# Patient Record
Sex: Male | Born: 1955 | Race: White | Hispanic: No | Marital: Married
Health system: Southern US, Community
[De-identification: ages and names within clinical notes are randomized; demographics above are authoritative.]

## PROBLEM LIST (undated history)

## (undated) DIAGNOSIS — M1990 Unspecified osteoarthritis, unspecified site: Secondary | ICD-10-CM

## (undated) DIAGNOSIS — I4510 Unspecified right bundle-branch block: Secondary | ICD-10-CM

## (undated) DIAGNOSIS — I2510 Atherosclerotic heart disease of native coronary artery without angina pectoris: Secondary | ICD-10-CM

## (undated) DIAGNOSIS — E7800 Pure hypercholesterolemia, unspecified: Secondary | ICD-10-CM

## (undated) DIAGNOSIS — K219 Gastro-esophageal reflux disease without esophagitis: Secondary | ICD-10-CM

## (undated) HISTORY — PX: VASECTOMY: SHX75

## (undated) HISTORY — PX: MENISCUS REPAIR: SHX5179

---

## 2008-01-04 ENCOUNTER — Encounter: Admission: RE | Admit: 2008-01-04 | Discharge: 2008-01-04 | Payer: Self-pay | Admitting: Orthopaedic Surgery

## 2010-07-12 IMAGING — CT CT L SPINE W/O CM
3 of 11 series · 10 of 33 positions shown, 12 images · non-contrast
Comparison: None

CLINICAL DATA: Fell.  T12 fracture.

CT LUMBAR SPINE WITHOUT CONTRAST
TECHNIQUE: Multidetector CT imaging of the lumbar spine was
performed without intravenous contrast administration. Multiplanar
CT image reconstructions were also generated.

[Series 3: bone windows · axial · 0.27mm/px · z∈[-47,+38]mm · 2 of 103 slices shown, 3 images]
[im 35/103  soft-tissue]
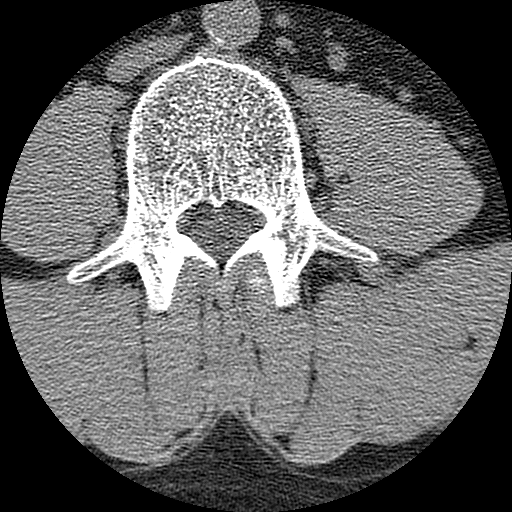
[im 35/103  bone]
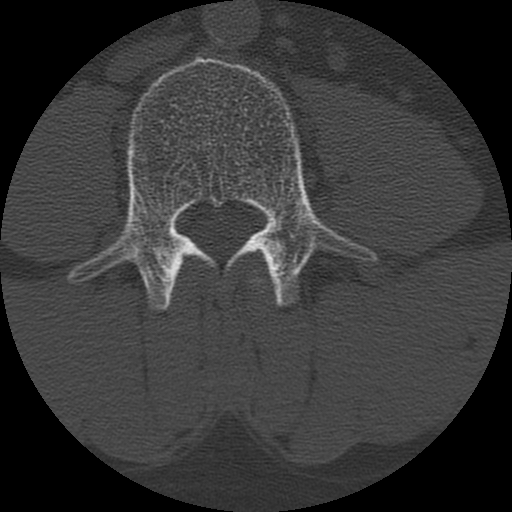
[im 69/103  bone]
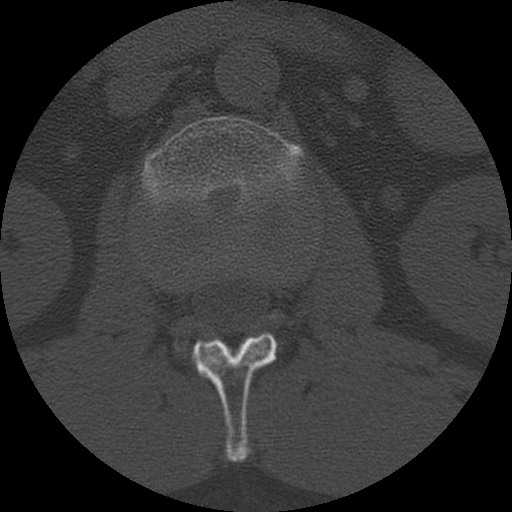

[Series 400: sagittal · sagittal · 0.51mm/px · 5 of 40 slices shown, 6 images]
[im 14/40  bone]
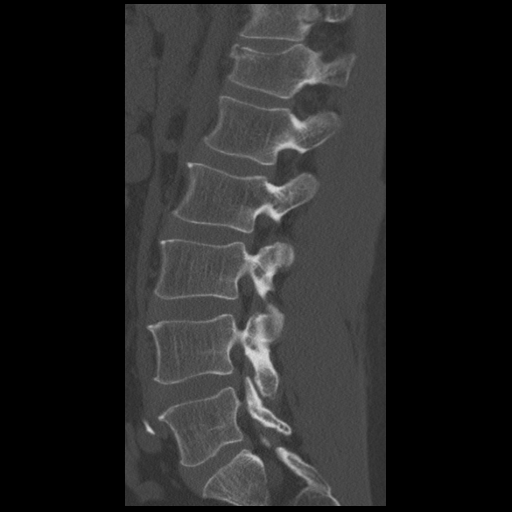
[im 17/40  bone]
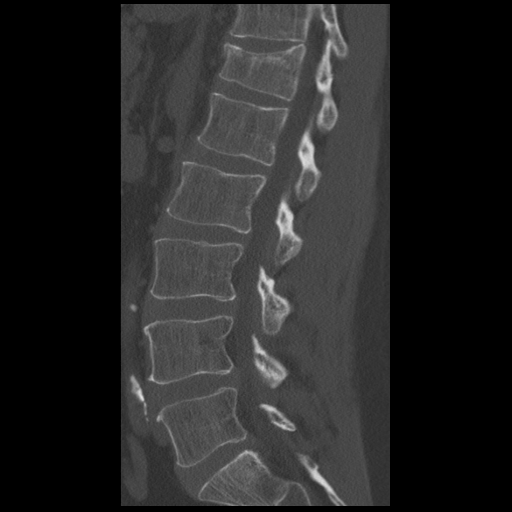
[im 20/40  soft-tissue]
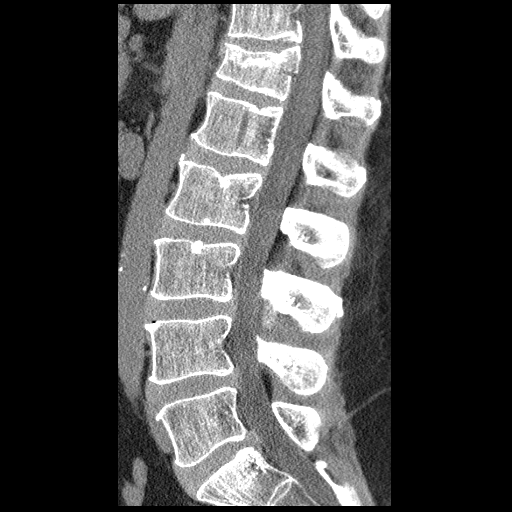
[im 20/40  bone]
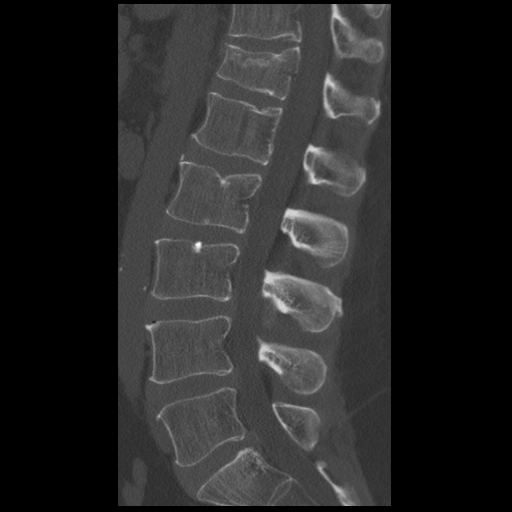
[im 23/40  bone]
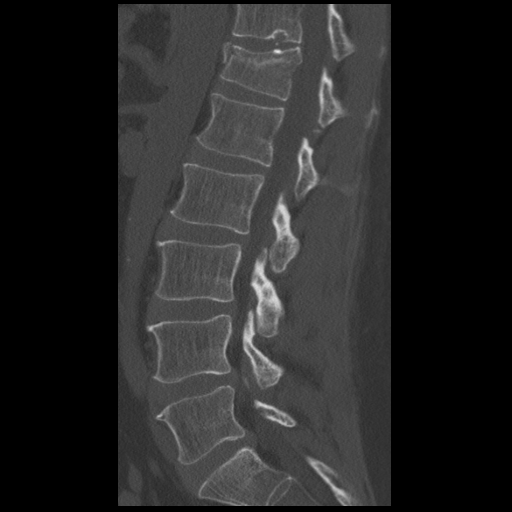
[im 27/40  bone]
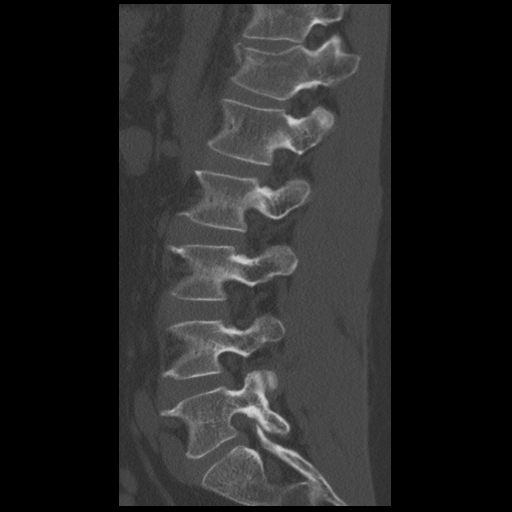

[Series 401: coronal · coronal · 0.51mm/px · 3 of 45 slices shown]
[im 9/45  bone]
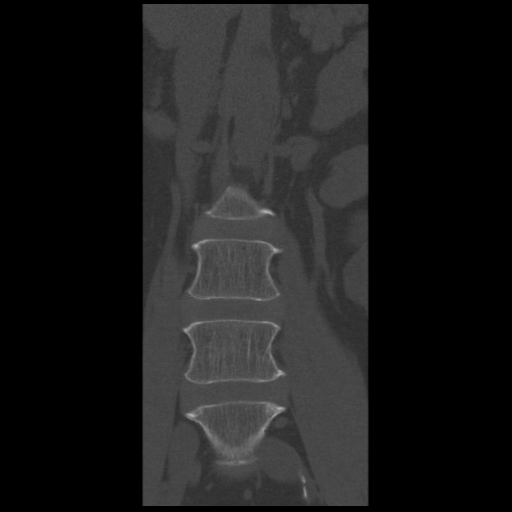
[im 18/45  bone]
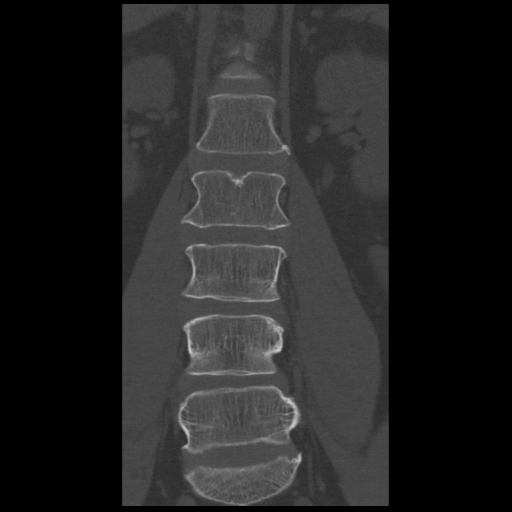
[im 27/45  bone]
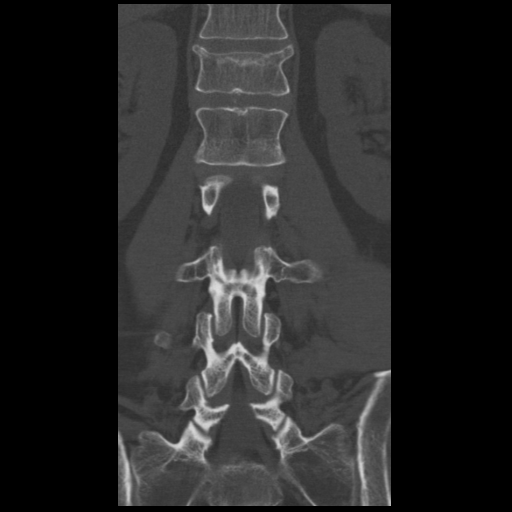

[10 of 33 positions shown; findings below may reference images not displayed]

FINDINGS: The scan begins at the mid T11.  No fracture of the
inferior aspect of T11 is seen.  The T11-12 disc does not show any
traumatic herniation.

T12 shows a superior endplate compression fracture with loss of
height of one third.  There is minimal posterior bowing of the
posterior superior margin of the vertebral body, no more than 2 mm.
This does not appear to encroach upon the spinal canal in any
significant fashion.  The fracture appears symmetric from right to
left.  There is no involvement of the posterior elements.  The T12-
L1 disc does not show any traumatic disc herniation.

No abnormality of note is present at L1-2, L2-3 or L3-4.

At L4-5, the disc bulges mildly.  There is mild narrowing of the
lateral recesses without gross neural compression.

At L5-S1, the disc shows a shallow broad-based protrusion but does
not appear to cause neural compression.
IMPRESSION: Ordinary benign-appearing superior endplate compression fracture at
T12 with loss of height of one third.  2 mm posterior bowing of the
posterior superior margin of the vertebral body, without
significant encroachment upon the spinal canal.  No unexpected
components of the fracture.  No involvement of the posterior
elements.

## 2019-02-25 ENCOUNTER — Ambulatory Visit: Payer: BC Managed Care – PPO | Attending: Internal Medicine

## 2019-02-25 DIAGNOSIS — Z23 Encounter for immunization: Secondary | ICD-10-CM | POA: Insufficient documentation

## 2019-02-25 NOTE — Progress Notes (Signed)
   Covid-19 Vaccination Clinic  Name:  Clarence Foster    MRN: [id number] DOB: 1955/05/29  02/25/2019  [name] was observed post Covid-19 immunization for 15 minutes without incidence. He was provided with Vaccine Information Sheet and instruction to access the V-Safe system.   [name] was instructed to call 911 with any severe reactions post vaccine: Marland Kitchen Difficulty breathing  . Swelling of your face and throat  . A fast heartbeat  . A bad rash all over your body  . Dizziness and weakness    Immunizations Administered    Name Date Dose VIS Date Route   Pfizer COVID-19 Vaccine 02/25/2019 12:52 PM 0.3 mL 01/14/2019 Intramuscular   Manufacturer: [employer], [employer]   Lot: [id number]   NDC: [address]-2

## 2019-03-10 ENCOUNTER — Ambulatory Visit: Payer: Self-pay

## 2019-03-17 ENCOUNTER — Ambulatory Visit: Payer: BC Managed Care – PPO | Attending: Internal Medicine

## 2019-03-17 DIAGNOSIS — Z23 Encounter for immunization: Secondary | ICD-10-CM | POA: Insufficient documentation

## 2019-03-17 NOTE — Progress Notes (Signed)
   Covid-19 Vaccination Clinic  Name:  AYDEEN BLUME    MRN: [id number] DOB: Jul 10, 1955  03/17/2019  Mr. Yoo was observed post Covid-19 immunization for 15 minutes without incidence. He was provided with Vaccine Information Sheet and instruction to access the V-Safe system.   Mr. Amory was instructed to call 911 with any severe reactions post vaccine: Marland Kitchen Difficulty breathing  . Swelling of your face and throat  . A fast heartbeat  . A bad rash all over your body  . Dizziness and weakness    Immunizations Administered    Name Date Dose VIS Date Route   Pfizer COVID-19 Vaccine 03/17/2019  3:50 PM 0.3 mL 01/14/2019 Intramuscular   Manufacturer: [employer], [employer]   Lot: [id number]   NDC: [address]-3

## 2019-07-11 DIAGNOSIS — S20479A Other superficial bite of unspecified back wall of thorax, initial encounter: Secondary | ICD-10-CM | POA: Diagnosis not present

## 2019-07-11 DIAGNOSIS — W57XXXA Bitten or stung by nonvenomous insect and other nonvenomous arthropods, initial encounter: Secondary | ICD-10-CM | POA: Diagnosis not present

## 2019-08-28 DIAGNOSIS — J209 Acute bronchitis, unspecified: Secondary | ICD-10-CM | POA: Diagnosis not present

## 2019-08-28 DIAGNOSIS — J22 Unspecified acute lower respiratory infection: Secondary | ICD-10-CM | POA: Diagnosis not present

## 2019-08-28 DIAGNOSIS — Z03818 Encounter for observation for suspected exposure to other biological agents ruled out: Secondary | ICD-10-CM | POA: Diagnosis not present

## 2019-11-29 DIAGNOSIS — L570 Actinic keratosis: Secondary | ICD-10-CM | POA: Diagnosis not present

## 2019-11-29 DIAGNOSIS — D225 Melanocytic nevi of trunk: Secondary | ICD-10-CM | POA: Diagnosis not present

## 2019-11-29 DIAGNOSIS — X32XXXA Exposure to sunlight, initial encounter: Secondary | ICD-10-CM | POA: Diagnosis not present

## 2019-11-29 DIAGNOSIS — L720 Epidermal cyst: Secondary | ICD-10-CM | POA: Diagnosis not present

## 2020-01-25 DIAGNOSIS — Z20822 Contact with and (suspected) exposure to covid-19: Secondary | ICD-10-CM | POA: Diagnosis not present

## 2020-03-27 DIAGNOSIS — M545 Low back pain, unspecified: Secondary | ICD-10-CM | POA: Diagnosis not present

## 2020-03-27 DIAGNOSIS — H8113 Benign paroxysmal vertigo, bilateral: Secondary | ICD-10-CM | POA: Diagnosis not present

## 2020-03-27 DIAGNOSIS — Z125 Encounter for screening for malignant neoplasm of prostate: Secondary | ICD-10-CM | POA: Diagnosis not present

## 2020-03-27 DIAGNOSIS — Z23 Encounter for immunization: Secondary | ICD-10-CM | POA: Diagnosis not present

## 2020-03-27 DIAGNOSIS — E7800 Pure hypercholesterolemia, unspecified: Secondary | ICD-10-CM | POA: Diagnosis not present

## 2020-03-27 DIAGNOSIS — G4700 Insomnia, unspecified: Secondary | ICD-10-CM | POA: Diagnosis not present

## 2020-03-27 DIAGNOSIS — L300 Nummular dermatitis: Secondary | ICD-10-CM | POA: Diagnosis not present

## 2020-03-27 DIAGNOSIS — Z1211 Encounter for screening for malignant neoplasm of colon: Secondary | ICD-10-CM | POA: Diagnosis not present

## 2020-03-27 DIAGNOSIS — Z0000 Encounter for general adult medical examination without abnormal findings: Secondary | ICD-10-CM | POA: Diagnosis not present

## 2020-06-01 DIAGNOSIS — R059 Cough, unspecified: Secondary | ICD-10-CM | POA: Diagnosis not present

## 2020-06-01 DIAGNOSIS — U071 COVID-19: Secondary | ICD-10-CM | POA: Diagnosis not present

## 2020-06-01 DIAGNOSIS — R509 Fever, unspecified: Secondary | ICD-10-CM | POA: Diagnosis not present

## 2020-06-01 DIAGNOSIS — J069 Acute upper respiratory infection, unspecified: Secondary | ICD-10-CM | POA: Diagnosis not present

## 2020-07-31 DIAGNOSIS — D122 Benign neoplasm of ascending colon: Secondary | ICD-10-CM | POA: Diagnosis not present

## 2020-07-31 DIAGNOSIS — K644 Residual hemorrhoidal skin tags: Secondary | ICD-10-CM | POA: Diagnosis not present

## 2020-07-31 DIAGNOSIS — K648 Other hemorrhoids: Secondary | ICD-10-CM | POA: Diagnosis not present

## 2020-07-31 DIAGNOSIS — D123 Benign neoplasm of transverse colon: Secondary | ICD-10-CM | POA: Diagnosis not present

## 2020-07-31 DIAGNOSIS — Z1211 Encounter for screening for malignant neoplasm of colon: Secondary | ICD-10-CM | POA: Diagnosis not present

## 2020-07-31 DIAGNOSIS — K5730 Diverticulosis of large intestine without perforation or abscess without bleeding: Secondary | ICD-10-CM | POA: Diagnosis not present

## 2020-08-03 DIAGNOSIS — D122 Benign neoplasm of ascending colon: Secondary | ICD-10-CM | POA: Diagnosis not present

## 2020-08-03 DIAGNOSIS — D123 Benign neoplasm of transverse colon: Secondary | ICD-10-CM | POA: Diagnosis not present

## 2020-08-30 DIAGNOSIS — M545 Low back pain, unspecified: Secondary | ICD-10-CM | POA: Diagnosis not present

## 2020-10-12 DIAGNOSIS — T148XXA Other injury of unspecified body region, initial encounter: Secondary | ICD-10-CM | POA: Diagnosis not present

## 2020-11-23 DIAGNOSIS — C44319 Basal cell carcinoma of skin of other parts of face: Secondary | ICD-10-CM | POA: Diagnosis not present

## 2020-11-23 DIAGNOSIS — L720 Epidermal cyst: Secondary | ICD-10-CM | POA: Diagnosis not present

## 2020-11-23 DIAGNOSIS — L570 Actinic keratosis: Secondary | ICD-10-CM | POA: Diagnosis not present

## 2020-11-23 DIAGNOSIS — D225 Melanocytic nevi of trunk: Secondary | ICD-10-CM | POA: Diagnosis not present

## 2020-11-23 DIAGNOSIS — X32XXXD Exposure to sunlight, subsequent encounter: Secondary | ICD-10-CM | POA: Diagnosis not present

## 2020-11-29 DIAGNOSIS — J209 Acute bronchitis, unspecified: Secondary | ICD-10-CM | POA: Diagnosis not present

## 2020-11-29 DIAGNOSIS — R0981 Nasal congestion: Secondary | ICD-10-CM | POA: Diagnosis not present

## 2021-04-08 DIAGNOSIS — E7800 Pure hypercholesterolemia, unspecified: Secondary | ICD-10-CM | POA: Diagnosis not present

## 2021-04-08 DIAGNOSIS — H8113 Benign paroxysmal vertigo, bilateral: Secondary | ICD-10-CM | POA: Diagnosis not present

## 2021-04-08 DIAGNOSIS — G4700 Insomnia, unspecified: Secondary | ICD-10-CM | POA: Diagnosis not present

## 2021-04-08 DIAGNOSIS — Z1389 Encounter for screening for other disorder: Secondary | ICD-10-CM | POA: Diagnosis not present

## 2021-04-08 DIAGNOSIS — K219 Gastro-esophageal reflux disease without esophagitis: Secondary | ICD-10-CM | POA: Diagnosis not present

## 2021-04-08 DIAGNOSIS — Z0000 Encounter for general adult medical examination without abnormal findings: Secondary | ICD-10-CM | POA: Diagnosis not present

## 2021-04-08 DIAGNOSIS — Z125 Encounter for screening for malignant neoplasm of prostate: Secondary | ICD-10-CM | POA: Diagnosis not present

## 2021-04-08 DIAGNOSIS — Z23 Encounter for immunization: Secondary | ICD-10-CM | POA: Diagnosis not present

## 2021-04-08 DIAGNOSIS — L300 Nummular dermatitis: Secondary | ICD-10-CM | POA: Diagnosis not present

## 2021-04-08 DIAGNOSIS — M545 Low back pain, unspecified: Secondary | ICD-10-CM | POA: Diagnosis not present

## 2021-05-01 DIAGNOSIS — R051 Acute cough: Secondary | ICD-10-CM | POA: Diagnosis not present

## 2021-05-01 DIAGNOSIS — J0140 Acute pansinusitis, unspecified: Secondary | ICD-10-CM | POA: Diagnosis not present

## 2021-05-28 DIAGNOSIS — J069 Acute upper respiratory infection, unspecified: Secondary | ICD-10-CM | POA: Diagnosis not present

## 2021-05-28 DIAGNOSIS — J0100 Acute maxillary sinusitis, unspecified: Secondary | ICD-10-CM | POA: Diagnosis not present

## 2021-06-06 DIAGNOSIS — J208 Acute bronchitis due to other specified organisms: Secondary | ICD-10-CM | POA: Diagnosis not present

## 2021-06-06 DIAGNOSIS — J4540 Moderate persistent asthma, uncomplicated: Secondary | ICD-10-CM | POA: Diagnosis not present

## 2021-08-31 DIAGNOSIS — M79671 Pain in right foot: Secondary | ICD-10-CM | POA: Diagnosis not present

## 2021-08-31 DIAGNOSIS — M722 Plantar fascial fibromatosis: Secondary | ICD-10-CM | POA: Diagnosis not present

## 2021-09-05 ENCOUNTER — Encounter: Payer: Self-pay | Admitting: Orthopaedic Surgery

## 2021-09-05 ENCOUNTER — Ambulatory Visit: Payer: Medicare PPO | Admitting: Orthopaedic Surgery

## 2021-09-05 DIAGNOSIS — M722 Plantar fascial fibromatosis: Secondary | ICD-10-CM | POA: Diagnosis not present

## 2021-09-05 MED ORDER — METHYLPREDNISOLONE ACETATE 40 MG/ML IJ SUSP
40.0000 mg | INTRAMUSCULAR | Status: AC | PRN
  Administered 2021-09-05: 40 mg

## 2021-09-05 MED ORDER — LIDOCAINE HCL 1 % IJ SOLN
1.0000 mL | INTRAMUSCULAR | Status: AC | PRN
  Administered 2021-09-05: 1 mL

## 2021-09-05 NOTE — Progress Notes (Signed)
Office Visit Note   Patient: Clarence Foster           Date of Birth: 1956/01/21           MRN: [id number] Visit Date: 09/05/2021              Requested by: No referring provider defined for this encounter. PCP: Patient, No Pcp Per   Assessment & Plan: Visit Diagnoses:  1. Plantar fasciitis of right foot     Plan: Clarence Foster is a pleasant 66 year old gentleman with a chief complaint of right plantar heel pain for 2 weeks.  He admits this may have been going on a bit longer but it became acute in the last 2 weeks.  He denies any injuries.  He was seen at an urgent care because of the pain.  Unfortunately he cannot take anti-inflammatories but they did give him a few prednisone which she is unsure if it is helped much.  He has been using Voltaren gel as well as a small strap on arch support which she actually thinks is helped a little bit denies any posterior heel pain but points to the pain on the bottom of his heel at the insertion of the plantar fascia.  Findings consistent with plantar fasciitis.  On exam he is focally tender at the plantar insertion of the plantar fascia.  He does not have any pain posteriorly.  X-rays he brought with him demonstrate a small bone spur at the plantar insertion of the plantar fascia as well as the posterior calcaneus because this is been painful and he cannot take anti-inflammatories we suggested an injection today.  He can also continue use Voltaren gel.  We have given him full-length arch support.  Also information on stretching properly.  May follow-up as needed  Follow-Up Instructions: As needed  Orders:  Orders Placed This Encounter  Procedures   Foot Inj   No orders of the defined types were placed in this encounter.     Procedures: Foot Inj  Date/Time: 09/05/2021 2:27 PM  Performed by: Clarence Foster, [place], [place] Authorized by: Clarence Foster, [place], [place]   Consent Given by:  Patient Indications:  Fasciitis and pain Condition: Plantar Fasciitis    Location: right plantar fascia muscle   Prep: patient was prepped and draped in usual sterile fashion   Needle Size:  22 G and 27 G Approach:  Plantar Medications:  1 mL lidocaine 1 %; 40 mg methylPREDNISolone acetate 40 MG/ML Patient Tolerance:  Patient tolerated the procedure well with no immediate complications    Clinical Data: No additional findings.   Subjective: Chief Complaint  Patient presents with   Right Heel - New Patient (Initial Visit)   Patient presents today as a new patient for right heel pain. Reports that pain was noticed around 3-6 months ago and now within the past 1-2 weeks he has been having increased sensitivity and unable to apply weight. Patient was informed that he had a bone spur ad plantar fasciitis. He denies having any injury or fall to have started the pain nor has he had any previous surgeries. Prednisone 10mg  has been prescribed and completed.     Review of Systems  All other systems reviewed and are negative.    Objective: Vital Signs: There were no vitals taken for this visit.  Physical Exam Constitutional:      Appearance: Normal appearance.  Pulmonary:     Effort: Pulmonary effort is normal.  Neurological:  General: No focal deficit present.     Mental Status: He is alert.     Ortho Exam  Specialty Comments:  No specialty comments available.  Imaging: No results found.   PMFS History: Patient Active Problem List   Diagnosis Date Noted   Plantar fasciitis of right foot 09/05/2021   History reviewed. No pertinent past medical history.  History reviewed. No pertinent family history.  History reviewed. No pertinent surgical history. Social History   Occupational History   Not on file  Tobacco Use   Smoking status: Not on file   Smokeless tobacco: Not on file  Substance and Sexual Activity   Alcohol use: Not on file   Drug use: Not on file   Sexual activity: Not on file

## 2021-10-02 ENCOUNTER — Telehealth: Payer: Self-pay

## 2021-10-02 ENCOUNTER — Ambulatory Visit: Payer: Medicare PPO | Admitting: Orthopaedic Surgery

## 2021-10-02 ENCOUNTER — Encounter: Payer: Self-pay | Admitting: Orthopaedic Surgery

## 2021-10-02 DIAGNOSIS — M25571 Pain in right ankle and joints of right foot: Secondary | ICD-10-CM | POA: Diagnosis not present

## 2021-10-02 DIAGNOSIS — M722 Plantar fascial fibromatosis: Secondary | ICD-10-CM

## 2021-10-02 MED ORDER — METHYLPREDNISOLONE ACETATE 40 MG/ML IJ SUSP
40.0000 mg | INTRAMUSCULAR | Status: AC | PRN
  Administered 2021-10-02: 40 mg

## 2021-10-02 MED ORDER — LIDOCAINE HCL 1 % IJ SOLN
1.0000 mL | INTRAMUSCULAR | Status: AC | PRN
  Administered 2021-10-02: 1 mL

## 2021-10-02 NOTE — Progress Notes (Signed)
Office Visit Note   Patient: Clarence Foster           Date of Birth: 1955/03/24           MRN: [id number] Visit Date: 10/02/2021              Requested by: No referring provider defined for this encounter. PCP: Patient, No Pcp Per   Assessment & Plan: Visit Diagnoses:  1. Plantar fasciitis of right foot     Plan: [name] is a pleasant 66 year old gentleman with a history of plantar fasciitis in his right heel.  3 weeks ago he was injected with cortisone and local anesthetic.  He now has pain and a little bit different area to and is wondering if he can have this injected.  No new injury.  Repeat cortisone injection to the mid heel with relief of his pain.  He will wear comfortable arch supports  Follow-Up Instructions: As needed  Orders:  No orders of the defined types were placed in this encounter.  No orders of the defined types were placed in this encounter.     Procedures: Foot Inj  Date/Time: 10/02/2021 2:49 PM  Performed by: Carlito Bogert, [place], [place] Authorized by: Idalie Canto, [place], [place]   Consent Given by:  Patient Timeout: prior to procedure the correct patient, procedure, and site was verified   Indications:  Fasciitis and pain Condition: Plantar Fasciitis   Location: right plantar fascia muscle   Needle Size:  22 G and 27 G Approach:  Plantar Medications:  1 mL lidocaine 1 %; 40 mg methylPREDNISolone acetate 40 MG/ML Patient Tolerance:  Patient tolerated the procedure well with no immediate complications   Clinical Data: No additional findings.   Subjective: Chief Complaint  Patient presents with   Right Heel - Follow-up  Patient presents today for follow up on his right heel. He received a cortisone injection with Dr.Whitfield a few weeks ago. He said that the injection helped at the site of injection, but is now having pain in the bottom center of his heel. He said that it does not matter if he is resting or not.   HPI  Review of Systems  All  other systems reviewed and are negative.    Objective: Vital Signs: There were no vitals taken for this visit.  Physical Exam Constitutional:      Appearance: Normal appearance.  Pulmonary:     Effort: Pulmonary effort is normal.  Skin:    General: Skin is warm and dry.  Neurological:     Mental Status: He is alert.  Psychiatric:        Mood and Affect: Mood normal.    Ortho Exam Examination of his right foot he has no redness no swelling.  Foot is warm with brisk capillary refill easily palpable dorsal pulse.  He does have a focal area of tenderness in the central portion of the plantar surface of the heel sensation is intact Specialty Comments:  No specialty comments available.  Imaging: No results found.   PMFS History: Patient Active Problem List   Diagnosis Date Noted   Plantar fasciitis of right foot 09/05/2021   History reviewed. No pertinent past medical history.  History reviewed. No pertinent family history.  History reviewed. No pertinent surgical history. Social History   Occupational History   Not on file  Tobacco Use   Smoking status: Not on file   Smokeless tobacco: Not on file  Substance and Sexual Activity  Alcohol use: Not on file   Drug use: Not on file   Sexual activity: Not on file

## 2021-10-02 NOTE — Telephone Encounter (Signed)
Per pt "the center of my heel is quite painful and I'm wondering if another injection would be helpful. A phone call is fine to discuss the possibility."  Cb# [phone]  Please advise

## 2021-10-02 NOTE — Telephone Encounter (Signed)
Pt portal message sent to the pt advising

## 2021-10-02 NOTE — Telephone Encounter (Signed)
Call and have pt return for another cortisone injection-thanks

## 2021-11-28 ENCOUNTER — Encounter: Payer: Self-pay | Admitting: Orthopaedic Surgery

## 2021-11-28 ENCOUNTER — Ambulatory Visit: Payer: Medicare PPO | Admitting: Orthopaedic Surgery

## 2021-11-28 DIAGNOSIS — M722 Plantar fascial fibromatosis: Secondary | ICD-10-CM | POA: Diagnosis not present

## 2021-11-28 NOTE — Progress Notes (Signed)
Office Visit Note   Patient: Clarence Foster           Date of Birth: 1955-04-30           MRN: [id number] Visit Date: 11/28/2021              Requested by: No referring provider defined for this encounter. PCP: Patient, No Pcp Per   Assessment & Plan: Visit Diagnoses:  1. Plantar fasciitis of right foot     Plan: Mr. Abernethy has been followed recently for the plantar fasciitis of the right heel.  He had a cortisone injection in early and late August with significant reduction of his pain but still having 1 area in the mid plantar aspect of his os calcis that still uncomfortable.  He is try different shoe inserts.  We discussed different treatment options including an another injection, heel pads and even an MRI scan to rule out some other abnormality like a stress fracture.  He like to try 1 more injection over the point of greatest tenderness, the heel pads and then over the next 2 to 3 weeks will call me if not any better will obtain an MRI scan of the right heel  Follow-Up Instructions: Return if symptoms worsen or fail to improve.   Orders:  No orders of the defined types were placed in this encounter.  No orders of the defined types were placed in this encounter.     Procedures: No procedures performed   Clinical Data: No additional findings.   Subjective: Chief Complaint  Patient presents with   Right Foot - Pain  Patient presents today for follow up on his right heel pain. He said that he has had two injection with Dr.Addelynn Batte in the last two months for plantar fasciitis. He continues to have pain in the bottom of his heel. He takes Tylenol if needed. He said that the pain is starting to affect how he walks.   HPI  Review of Systems   Objective: Vital Signs: There were no vitals taken for this visit.  Physical Exam Constitutional:      Appearance: He is well-developed.  Eyes:     Pupils: Pupils are equal, round, and reactive to light.  Pulmonary:      Effort: Pulmonary effort is normal.  Skin:    General: Skin is warm and dry.  Neurological:     Mental Status: He is alert and oriented to person, place, and time.  Psychiatric:        Behavior: Behavior normal.     Ortho Exam awake alert and oriented x3.  Comfortable sitting.  Right heel with an area of tenderness of about the size of a dime in the mid plantar aspect of the heel.  No skin changes neurologically intact.  No pain in the arch.  No pain about the posterior os calcis or Achilles.  Specialty Comments:  No specialty comments available.  Imaging: No results found.   PMFS History: Patient Active Problem List   Diagnosis Date Noted   Plantar fasciitis of right foot 09/05/2021   History reviewed. No pertinent past medical history.  History reviewed. No pertinent family history.  History reviewed. No pertinent surgical history. Social History   Occupational History   Not on file  Tobacco Use   Smoking status: Not on file   Smokeless tobacco: Not on file  Substance and Sexual Activity   Alcohol use: Not on file   Drug use: Not on file  Sexual activity: Not on file

## 2021-12-18 DIAGNOSIS — R0981 Nasal congestion: Secondary | ICD-10-CM | POA: Diagnosis not present

## 2021-12-18 DIAGNOSIS — J329 Chronic sinusitis, unspecified: Secondary | ICD-10-CM | POA: Diagnosis not present

## 2021-12-18 DIAGNOSIS — J40 Bronchitis, not specified as acute or chronic: Secondary | ICD-10-CM | POA: Diagnosis not present

## 2021-12-18 DIAGNOSIS — R059 Cough, unspecified: Secondary | ICD-10-CM | POA: Diagnosis not present

## 2022-02-07 DIAGNOSIS — R059 Cough, unspecified: Secondary | ICD-10-CM | POA: Diagnosis not present

## 2022-02-07 DIAGNOSIS — U071 COVID-19: Secondary | ICD-10-CM | POA: Diagnosis not present

## 2022-03-12 DIAGNOSIS — J329 Chronic sinusitis, unspecified: Secondary | ICD-10-CM | POA: Diagnosis not present

## 2022-04-22 DIAGNOSIS — G4700 Insomnia, unspecified: Secondary | ICD-10-CM | POA: Diagnosis not present

## 2022-04-22 DIAGNOSIS — M545 Low back pain, unspecified: Secondary | ICD-10-CM | POA: Diagnosis not present

## 2022-04-22 DIAGNOSIS — E7800 Pure hypercholesterolemia, unspecified: Secondary | ICD-10-CM | POA: Diagnosis not present

## 2022-04-22 DIAGNOSIS — J302 Other seasonal allergic rhinitis: Secondary | ICD-10-CM | POA: Diagnosis not present

## 2022-04-22 DIAGNOSIS — L300 Nummular dermatitis: Secondary | ICD-10-CM | POA: Diagnosis not present

## 2022-04-22 DIAGNOSIS — Z0000 Encounter for general adult medical examination without abnormal findings: Secondary | ICD-10-CM | POA: Diagnosis not present

## 2022-04-22 DIAGNOSIS — Z125 Encounter for screening for malignant neoplasm of prostate: Secondary | ICD-10-CM | POA: Diagnosis not present

## 2022-04-22 DIAGNOSIS — Z1159 Encounter for screening for other viral diseases: Secondary | ICD-10-CM | POA: Diagnosis not present

## 2022-04-22 DIAGNOSIS — K219 Gastro-esophageal reflux disease without esophagitis: Secondary | ICD-10-CM | POA: Diagnosis not present

## 2022-04-22 DIAGNOSIS — N401 Enlarged prostate with lower urinary tract symptoms: Secondary | ICD-10-CM | POA: Diagnosis not present

## 2022-08-08 ENCOUNTER — Ambulatory Visit (INDEPENDENT_AMBULATORY_CARE_PROVIDER_SITE_OTHER): Payer: Medicare PPO | Admitting: Student

## 2022-08-08 ENCOUNTER — Encounter (HOSPITAL_BASED_OUTPATIENT_CLINIC_OR_DEPARTMENT_OTHER): Payer: Self-pay | Admitting: Student

## 2022-08-08 ENCOUNTER — Other Ambulatory Visit (HOSPITAL_BASED_OUTPATIENT_CLINIC_OR_DEPARTMENT_OTHER): Payer: Self-pay

## 2022-08-08 ENCOUNTER — Ambulatory Visit (INDEPENDENT_AMBULATORY_CARE_PROVIDER_SITE_OTHER): Payer: Medicare PPO

## 2022-08-08 DIAGNOSIS — S42202A Unspecified fracture of upper end of left humerus, initial encounter for closed fracture: Secondary | ICD-10-CM | POA: Diagnosis not present

## 2022-08-08 DIAGNOSIS — M25512 Pain in left shoulder: Secondary | ICD-10-CM | POA: Diagnosis not present

## 2022-08-08 DIAGNOSIS — S42295A Other nondisplaced fracture of upper end of left humerus, initial encounter for closed fracture: Secondary | ICD-10-CM | POA: Diagnosis not present

## 2022-08-08 MED ORDER — TRAMADOL HCL 50 MG PO TABS
50.0000 mg | ORAL_TABLET | Freq: Four times a day (QID) | ORAL | 0 refills | Status: AC | PRN
  Filled 2022-08-08: qty 12, 3d supply, fill #0

## 2022-08-08 NOTE — Progress Notes (Signed)
Chief Complaint: Left shoulder pain     History of Present Illness:    Clarence Foster is a 67 y.o. male presenting to clinic today for evaluation of pain in his left shoulder.  This began yesterday after he stumbled and directly impacted his shoulder into the side of his JetSki while attempting to remove the cover.  He has had significant increases of swelling in the front of the shoulder and has began bruising.  Pain level today is a 8 out of 10.  In the range of motion of the shoulder sharply increases pain.  Denies any numbness or tingling.  Has been taking Tylenol as he cannot take NSAIDs due to kidney function.   Surgical History:   None  PMH/PSH/Family History/Social History/Meds/Allergies:   History reviewed. No pertinent past medical history. History reviewed. No pertinent surgical history. Social History   Socioeconomic History   Marital status: Married    Spouse name: Not on file   Number of children: Not on file   Years of education: Not on file   Highest education level: Not on file  Occupational History   Not on file  Tobacco Use   Smoking status: Not on file   Smokeless tobacco: Not on file  Substance and Sexual Activity   Alcohol use: Not on file   Drug use: Not on file   Sexual activity: Not on file  Other Topics Concern   Not on file  Social History Narrative   Not on file   Social Determinants of Health   Financial Resource Strain: Not on file  Food Insecurity: Not on file  Transportation Needs: Not on file  Physical Activity: Not on file  Stress: Not on file  Social Connections: Not on file   History reviewed. No pertinent family history. Allergies  Allergen Reactions   Pseudoephedrine Other (See Comments)   Current Outpatient Medications  Medication Sig Dispense Refill   traMADol (ULTRAM) 50 MG tablet Take 1 tablet (50 mg total) by mouth every 6 (six) hours as needed for up to 3 days. 12 tablet 0   No  current facility-administered medications for this visit.   No results found.  Review of Systems:   A ROS was performed including pertinent positives and negatives as documented in the HPI.  Physical Exam :   Constitutional: NAD and appears stated age Neurological: Alert and oriented Psych: Appropriate affect and cooperative There were no vitals taken for this visit.   Comprehensive Musculoskeletal Exam:    Significant soft tissue edema noted over the anterior left shoulder with mild ecchymosis.  Active shoulder range of motion is very limited due to pain.  No significant tenderness over AC joint, posterior shoulder, or distal humerus and elbow.  Radial pulse 2+.  Patient is able to form a fist but lacks some strength.  Neurosensory exam intact.  Imaging:   Xray (left shoulder 3 views): Nondisplaced proximal humerus fracture   I personally reviewed and interpreted the radiographs.   Assessment:   67 y.o. male with a nondisplaced proximal humerus fracture after a fall yesterday.  Range of motion is very limited but he remains neurovascularly intact.  Recommend proceeding with immobilization in a sling for at least the next 4 weeks.  I would like to see him back at that time for reevaluation  and discussed that once adequate healing has been achieved we will begin to work on regaining range of motion.  His pain is poorly controlled on Tylenol currently so I will send in a few days of tramadol and I believe that the sling will help with this as well.  Plan :    -Return to clinic in 4 weeks for reevaluation     I personally saw and evaluated the patient, and participated in the management and treatment plan.  Hazle Nordmann, PA-C Orthopedics  This document was dictated using Conservation officer, historic buildings. A reasonable attempt at proof reading has been made to minimize errors.

## 2022-08-12 ENCOUNTER — Other Ambulatory Visit (HOSPITAL_BASED_OUTPATIENT_CLINIC_OR_DEPARTMENT_OTHER): Payer: Self-pay | Admitting: Student

## 2022-08-12 ENCOUNTER — Encounter (HOSPITAL_BASED_OUTPATIENT_CLINIC_OR_DEPARTMENT_OTHER): Payer: Self-pay

## 2022-08-12 MED ORDER — OXYCODONE HCL 5 MG PO TABS: 5.0000 mg | ORAL_TABLET | ORAL | 0 refills | Status: AC | PRN

## 2022-08-12 NOTE — Telephone Encounter (Signed)
Spoke to pt, sent some oxycodone to take as needed at night

## 2022-09-04 ENCOUNTER — Ambulatory Visit (INDEPENDENT_AMBULATORY_CARE_PROVIDER_SITE_OTHER): Payer: Medicare PPO

## 2022-09-04 ENCOUNTER — Ambulatory Visit (INDEPENDENT_AMBULATORY_CARE_PROVIDER_SITE_OTHER): Payer: Medicare PPO | Admitting: Student

## 2022-09-04 DIAGNOSIS — S42202D Unspecified fracture of upper end of left humerus, subsequent encounter for fracture with routine healing: Secondary | ICD-10-CM | POA: Diagnosis not present

## 2022-09-04 DIAGNOSIS — S42352D Displaced comminuted fracture of shaft of humerus, left arm, subsequent encounter for fracture with routine healing: Secondary | ICD-10-CM | POA: Diagnosis not present

## 2022-09-04 DIAGNOSIS — S42202A Unspecified fracture of upper end of left humerus, initial encounter for closed fracture: Secondary | ICD-10-CM

## 2022-09-04 NOTE — Progress Notes (Signed)
Chief Complaint: Left shoulder pain     History of Present Illness:   09/04/22: Patient presents today for follow-up.  He states that he has been doing much better and pain is more controlled.  Pain levels are now mild and reports that are no longer throbs.  He has been wearing his sling consistently.  He has noticed decrease in the amount of swelling.  States that his muscles feel tight and that he feels like he has knots on the outside of the shoulder.    08/08/22: JAMONT COCANOUGHER is a 67 y.o. male presenting to clinic today for evaluation of pain in his left shoulder.  This began yesterday after he stumbled and directly impacted his shoulder into the side of his JetSki while attempting to remove the cover.  He has had significant increases of swelling in the front of the shoulder and has began bruising.  Pain level today is a 8 out of 10.  In the range of motion of the shoulder sharply increases pain.  Denies any numbness or tingling.  Has been taking Tylenol as he cannot take NSAIDs due to kidney function.   Surgical History:   None  PMH/PSH/Family History/Social History/Meds/Allergies:   No past medical history on file. No past surgical history on file. Social History   Socioeconomic History   Marital status: Married    Spouse name: Not on file   Number of children: Not on file   Years of education: Not on file   Highest education level: Not on file  Occupational History   Not on file  Tobacco Use   Smoking status: Not on file   Smokeless tobacco: Not on file  Substance and Sexual Activity   Alcohol use: Not on file   Drug use: Not on file   Sexual activity: Not on file  Other Topics Concern   Not on file  Social History Narrative   Not on file   Social Determinants of Health   Financial Resource Strain: Not on file  Food Insecurity: Not on file  Transportation Needs: Not on file  Physical Activity: Not on file  Stress: Not on file   Social Connections: Not on file   No family history on file. Allergies  Allergen Reactions   Pseudoephedrine Other (See Comments)   No current outpatient medications on file.   No current facility-administered medications for this visit.   No results found.  Review of Systems:   A ROS was performed including pertinent positives and negatives as documented in the HPI.  Physical Exam :   Constitutional: NAD and appears stated age Neurological: Alert and oriented Psych: Appropriate affect and cooperative There were no vitals taken for this visit.   Comprehensive Musculoskeletal Exam:    Mild tenderness palpation over the lateral aspect of the proximal humerus.  Passive range of motion of the left shoulder is limited to about 30 degrees.  Left elbow stiff with active and passive range of motion with extension limited to 45 degrees.  Grip strength of the left hand is 3 out of 5.  Radial pulse 2+.  Distal neurosensory exam intact.  Imaging:   Xray (left humerus 2 views): Nondisplaced fracture of the proximal humerus with increased callus formation   I personally reviewed and interpreted the radiographs.   Assessment:  67 y.o. male 4 weeks status post nondisplaced left proximal humerus fracture.  Based on x-rays today, this is healing in well.  He has been wearing his sling very consistently and as result does have a lot of tightness and decreased range of motion of the shoulder and elbow.  Discussed that at this point he can begin working on range of motion without weight or resistance.  He can begin weaning out of the sling particularly for low-level activity.  I will plan on seeing him back in another 4 weeks to assess range of motion and likely progress strengthening at that time.  Can consider addition of physical therapy if needed.  Plan :    -Return to clinic in another 4 weeks for reevaluation     I personally saw and evaluated the patient, and participated in the  management and treatment plan.  Hazle Nordmann, PA-C Orthopedics  This document was dictated using Conservation officer, historic buildings. A reasonable attempt at proof reading has been made to minimize errors.

## 2022-10-02 ENCOUNTER — Ambulatory Visit (INDEPENDENT_AMBULATORY_CARE_PROVIDER_SITE_OTHER): Payer: Medicare PPO

## 2022-10-02 ENCOUNTER — Encounter (HOSPITAL_BASED_OUTPATIENT_CLINIC_OR_DEPARTMENT_OTHER): Payer: Self-pay | Admitting: Student

## 2022-10-02 ENCOUNTER — Ambulatory Visit (INDEPENDENT_AMBULATORY_CARE_PROVIDER_SITE_OTHER): Payer: Medicare PPO | Admitting: Student

## 2022-10-02 DIAGNOSIS — S42202D Unspecified fracture of upper end of left humerus, subsequent encounter for fracture with routine healing: Secondary | ICD-10-CM

## 2022-10-02 DIAGNOSIS — S42202A Unspecified fracture of upper end of left humerus, initial encounter for closed fracture: Secondary | ICD-10-CM | POA: Diagnosis not present

## 2022-10-02 NOTE — Progress Notes (Signed)
Chief Complaint: Left shoulder pain     History of Present Illness:   10/02/22: Clarence Foster is here today for follow-up of proximal humerus fracture that occurred on July 4.  Today he reports that his pain and function have continued to improve.  He has been able to progress more with ADLs.  Does still note some pain particularly at night that is making it hard to sleep.  He has been coming out of the sling some to work on elbow and shoulder range of motion.   09/04/22: Patient presents today for follow-up.  He states that he has been doing much better and pain is more controlled.  Pain levels are now mild and reports that are no longer throbs.  He has been wearing his sling consistently.  He has noticed decrease in the amount of swelling.  States that his muscles feel tight and that he feels like he has knots on the outside of the shoulder.    Surgical History:   None  PMH/PSH/Family History/Social History/Meds/Allergies:   History reviewed. No pertinent past medical history. History reviewed. No pertinent surgical history. Social History   Socioeconomic History   Marital status: Married    Spouse name: Not on file   Number of children: Not on file   Years of education: Not on file   Highest education level: Not on file  Occupational History   Not on file  Tobacco Use   Smoking status: Not on file   Smokeless tobacco: Not on file  Substance and Sexual Activity   Alcohol use: Not on file   Drug use: Not on file   Sexual activity: Not on file  Other Topics Concern   Not on file  Social History Narrative   Not on file   Social Determinants of Health   Financial Resource Strain: Not on file  Food Insecurity: Not on file  Transportation Needs: Not on file  Physical Activity: Not on file  Stress: Not on file  Social Connections: Not on file   History reviewed. No pertinent family history. Allergies  Allergen Reactions   Pseudoephedrine Other  (See Comments)   No current outpatient medications on file.   No current facility-administered medications for this visit.   No results found.  Review of Systems:   A ROS was performed including pertinent positives and negatives as documented in the HPI.  Physical Exam :   Constitutional: NAD and appears stated age Neurological: Alert and oriented Psych: Appropriate affect and cooperative There were no vitals taken for this visit.   Comprehensive Musculoskeletal Exam:    Mild tenderness about the proximal left shoulder.  30 degrees of active abduction and forward flexion, 40 degrees of passive forward flexion, and 10 degrees of external rotation of the left shoulder.  Left elbow range of motion from 20 degrees extension to 110 degrees flexion.  Left hand grip strength 4/5.  Imaging:   Xray (left shoulder 2 views): Healing proximal humerus fracture with increased bone formation since previous x-ray   I personally reviewed and interpreted the radiographs.   Assessment:   67 y.o. male 8 weeks status post nondisplaced left proximal humerus fracture.  His shoulder and elbow do remain very stiff.  Radiographs taken today do show good fracture healing.  Due to this, I would like to get  him referred over to physical therapy to aggressively work on progressing range of motion.  I have encouraged him to come out of the sling completely.  I will plan to see him back in 5 weeks for reassessment and once range of motion has significantly improved we can begin incorporating a strengthening program.  Plan :    -Referral to [facility] physical therapy for progressive shoulder and elbow range of motion -Return to clinic in 5 weeks for reassessment     I personally saw and evaluated the patient, and participated in the management and treatment plan.  Hazle Nordmann, PA-C Orthopedics

## 2022-10-08 DIAGNOSIS — M25512 Pain in left shoulder: Secondary | ICD-10-CM | POA: Diagnosis not present

## 2022-10-10 DIAGNOSIS — M25512 Pain in left shoulder: Secondary | ICD-10-CM | POA: Diagnosis not present

## 2022-10-13 DIAGNOSIS — M25512 Pain in left shoulder: Secondary | ICD-10-CM | POA: Diagnosis not present

## 2022-10-16 DIAGNOSIS — M25512 Pain in left shoulder: Secondary | ICD-10-CM | POA: Diagnosis not present

## 2022-10-20 DIAGNOSIS — M25512 Pain in left shoulder: Secondary | ICD-10-CM | POA: Diagnosis not present

## 2022-10-23 DIAGNOSIS — M25512 Pain in left shoulder: Secondary | ICD-10-CM | POA: Diagnosis not present

## 2022-10-27 DIAGNOSIS — M25512 Pain in left shoulder: Secondary | ICD-10-CM | POA: Diagnosis not present

## 2022-10-30 DIAGNOSIS — M25512 Pain in left shoulder: Secondary | ICD-10-CM | POA: Diagnosis not present

## 2022-11-03 DIAGNOSIS — M25512 Pain in left shoulder: Secondary | ICD-10-CM | POA: Diagnosis not present

## 2022-11-06 ENCOUNTER — Ambulatory Visit (HOSPITAL_BASED_OUTPATIENT_CLINIC_OR_DEPARTMENT_OTHER): Payer: Medicare PPO | Admitting: Student

## 2022-11-06 ENCOUNTER — Encounter (HOSPITAL_BASED_OUTPATIENT_CLINIC_OR_DEPARTMENT_OTHER): Payer: Self-pay | Admitting: Student

## 2022-11-06 ENCOUNTER — Ambulatory Visit (HOSPITAL_BASED_OUTPATIENT_CLINIC_OR_DEPARTMENT_OTHER): Payer: Medicare PPO

## 2022-11-06 DIAGNOSIS — S42202D Unspecified fracture of upper end of left humerus, subsequent encounter for fracture with routine healing: Secondary | ICD-10-CM

## 2022-11-06 NOTE — Progress Notes (Signed)
Chief Complaint: Left shoulder pain     History of Present Illness:   11/06/22: Patient presents today for follow-up of left shoulder proximal humerus fracture that occurred on 7//24.  He has been working with physical therapy to regain range of motion and has been noticing some improvements.  He reports that he has been able to perform more daily activities with his left shoulder.  He does still have muscular tightness as well as pain most notable when sleeping at night.  Surgical History:   None  PMH/PSH/Family History/Social History/Meds/Allergies:   History reviewed. No pertinent past medical history. History reviewed. No pertinent surgical history. Social History   Socioeconomic History   Marital status: Married    Spouse name: Not on file   Number of children: Not on file   Years of education: Not on file   Highest education level: Not on file  Occupational History   Not on file  Tobacco Use   Smoking status: Not on file   Smokeless tobacco: Not on file  Substance and Sexual Activity   Alcohol use: Not on file   Drug use: Not on file   Sexual activity: Not on file  Other Topics Concern   Not on file  Social History Narrative   Not on file   Social Determinants of Health   Financial Resource Strain: Not on file  Food Insecurity: Not on file  Transportation Needs: Not on file  Physical Activity: Not on file  Stress: Not on file  Social Connections: Not on file   History reviewed. No pertinent family history. Allergies  Allergen Reactions   Pseudoephedrine Other (See Comments)   No current outpatient medications on file.   No current facility-administered medications for this visit.   No results found.  Review of Systems:   A ROS was performed including pertinent positives and negatives as documented in the HPI.  Physical Exam :   Constitutional: NAD and appears stated age Neurological: Alert and oriented Psych:  Appropriate affect and cooperative There were no vitals taken for this visit.   Comprehensive Musculoskeletal Exam:    Active left shoulder range of motion to 90 degrees forward flexion, 80 degrees abduction, 20 degrees external rotation, and internal rotation to the left ASIS.  Mild tenderness with some increased tension noted over the lateral deltoid.  Imaging:   Xray (left shoulder 2 views): Proximal humerus fracture in the late stages of healing with increased bone formation and notable callus noted laterally   I personally reviewed and interpreted the radiographs.   Assessment:   67 y.o. male now 72-month status post nondisplaced comminuted fracture of the left proximal humerus.  This does have good evidence of healing on today's radiographs.  He does still have residual stiffness and muscular tightness which has improved some since last visit.  I would like him to continue with aggressive rehab with physical therapy to regain ROM and strength.  PT can progress as tolerated with no restrictions.  I have recommended occasional muscle relaxers as needed which she has at home to help with sleep and muscular stiffness.  I would like to see him back once more in 8 weeks to assess progress with PT.  Plan :    -Return for final recheck in 8 weeks     I personally saw  and evaluated the patient, and participated in the management and treatment plan.  Hazle Nordmann, PA-C Orthopedics

## 2022-11-12 DIAGNOSIS — M25512 Pain in left shoulder: Secondary | ICD-10-CM | POA: Diagnosis not present

## 2022-11-17 DIAGNOSIS — M25512 Pain in left shoulder: Secondary | ICD-10-CM | POA: Diagnosis not present

## 2022-11-20 DIAGNOSIS — M25512 Pain in left shoulder: Secondary | ICD-10-CM | POA: Diagnosis not present

## 2022-11-24 DIAGNOSIS — M25512 Pain in left shoulder: Secondary | ICD-10-CM | POA: Diagnosis not present

## 2022-11-26 DIAGNOSIS — M25512 Pain in left shoulder: Secondary | ICD-10-CM | POA: Diagnosis not present

## 2022-12-01 DIAGNOSIS — M25512 Pain in left shoulder: Secondary | ICD-10-CM | POA: Diagnosis not present

## 2022-12-03 DIAGNOSIS — M25512 Pain in left shoulder: Secondary | ICD-10-CM | POA: Diagnosis not present

## 2022-12-09 DIAGNOSIS — M25512 Pain in left shoulder: Secondary | ICD-10-CM | POA: Diagnosis not present

## 2022-12-11 DIAGNOSIS — M25512 Pain in left shoulder: Secondary | ICD-10-CM | POA: Diagnosis not present

## 2022-12-15 DIAGNOSIS — M25512 Pain in left shoulder: Secondary | ICD-10-CM | POA: Diagnosis not present

## 2022-12-18 DIAGNOSIS — M25512 Pain in left shoulder: Secondary | ICD-10-CM | POA: Diagnosis not present

## 2022-12-19 DIAGNOSIS — J329 Chronic sinusitis, unspecified: Secondary | ICD-10-CM | POA: Diagnosis not present

## 2022-12-19 DIAGNOSIS — J40 Bronchitis, not specified as acute or chronic: Secondary | ICD-10-CM | POA: Diagnosis not present

## 2022-12-22 DIAGNOSIS — M25512 Pain in left shoulder: Secondary | ICD-10-CM | POA: Diagnosis not present

## 2022-12-23 DIAGNOSIS — M25512 Pain in left shoulder: Secondary | ICD-10-CM | POA: Diagnosis not present

## 2022-12-29 DIAGNOSIS — M25512 Pain in left shoulder: Secondary | ICD-10-CM | POA: Diagnosis not present

## 2022-12-30 ENCOUNTER — Ambulatory Visit (HOSPITAL_BASED_OUTPATIENT_CLINIC_OR_DEPARTMENT_OTHER): Payer: Medicare PPO | Admitting: Student

## 2022-12-31 ENCOUNTER — Ambulatory Visit (HOSPITAL_BASED_OUTPATIENT_CLINIC_OR_DEPARTMENT_OTHER): Payer: Medicare PPO | Admitting: Student

## 2022-12-31 ENCOUNTER — Encounter (HOSPITAL_BASED_OUTPATIENT_CLINIC_OR_DEPARTMENT_OTHER): Payer: Self-pay | Admitting: Student

## 2022-12-31 DIAGNOSIS — S42202D Unspecified fracture of upper end of left humerus, subsequent encounter for fracture with routine healing: Secondary | ICD-10-CM | POA: Diagnosis not present

## 2022-12-31 DIAGNOSIS — Z23 Encounter for immunization: Secondary | ICD-10-CM | POA: Diagnosis not present

## 2022-12-31 DIAGNOSIS — M25512 Pain in left shoulder: Secondary | ICD-10-CM | POA: Diagnosis not present

## 2022-12-31 NOTE — Progress Notes (Signed)
Chief Complaint: Left shoulder pain     History of Present Illness:   12/31/22: Kyser is here today for follow-up of his left shoulder which she sustained a proximal humerus fracture on July 4.  Continues to work with physical therapy particularly for range of motion as he has had residual stiffness as a result of immobilization.  PT said this morning that he feels the shoulder is about 60%.  He has not been able to keep up with home exercises as well as he would like lately as he has been taking care of his mom who has been in and out of the hospital.  Surgical History:   None  PMH/PSH/Family History/Social History/Meds/Allergies:   No past medical history on file. No past surgical history on file. Social History   Socioeconomic History   Marital status: Married    Spouse name: Not on file   Number of children: Not on file   Years of education: Not on file   Highest education level: Not on file  Occupational History   Not on file  Tobacco Use   Smoking status: Not on file   Smokeless tobacco: Not on file  Substance and Sexual Activity   Alcohol use: Not on file   Drug use: Not on file   Sexual activity: Not on file  Other Topics Concern   Not on file  Social History Narrative   Not on file   Social Determinants of Health   Financial Resource Strain: Not on file  Food Insecurity: Not on file  Transportation Needs: Not on file  Physical Activity: Not on file  Stress: Not on file  Social Connections: Not on file   No family history on file. Allergies  Allergen Reactions   Pseudoephedrine Other (See Comments)   No current outpatient medications on file.   No current facility-administered medications for this visit.   No results found.  Review of Systems:   A ROS was performed including pertinent positives and negatives as documented in the HPI.  Physical Exam :   Constitutional: NAD and appears stated age Neurological: Alert  and oriented Psych: Appropriate affect and cooperative There were no vitals taken for this visit.   Comprehensive Musculoskeletal Exam:    Active range of motion of the left shoulder to 100 degrees forward flexion, 20 degrees external rotation, internal rotation to back pocket, and 90 degrees of abduction.  Can passively forward elevate to 120.  Imaging:    Assessment:   67 y.o. male now almost 5 months status post comminuted fracture to the left proximal humerus.  He does continue to have residual stiffness in the shoulder which is significantly hindering his range of motion.  He has been working with physical therapy and has seen slow progress.  I would like for him to continue with PT as long as he is seeing benefit.  Did discuss possibility of adhesive capsulitis so we will continue to monitor over the coming weeks.  Discussed that if he fails to see much progress over the next month or 2, I would like to see him back possibly for trial of a cortisone injection.  Otherwise I can plan to see him back on an as-needed basis.  Plan :    -Return to clinic as needed     I personally  saw and evaluated the patient, and participated in the management and treatment plan.  Hazle Nordmann, PA-C Orthopedics

## 2023-01-13 DIAGNOSIS — M25512 Pain in left shoulder: Secondary | ICD-10-CM | POA: Diagnosis not present

## 2023-01-19 DIAGNOSIS — M25512 Pain in left shoulder: Secondary | ICD-10-CM | POA: Diagnosis not present

## 2023-01-20 DIAGNOSIS — M25512 Pain in left shoulder: Secondary | ICD-10-CM | POA: Diagnosis not present

## 2023-01-26 DIAGNOSIS — M25512 Pain in left shoulder: Secondary | ICD-10-CM | POA: Diagnosis not present

## 2023-01-30 DIAGNOSIS — M25512 Pain in left shoulder: Secondary | ICD-10-CM | POA: Diagnosis not present

## 2023-02-02 DIAGNOSIS — M25512 Pain in left shoulder: Secondary | ICD-10-CM | POA: Diagnosis not present

## 2023-02-09 ENCOUNTER — Ambulatory Visit (HOSPITAL_BASED_OUTPATIENT_CLINIC_OR_DEPARTMENT_OTHER): Payer: Medicare PPO | Admitting: Student

## 2023-02-09 ENCOUNTER — Encounter (HOSPITAL_BASED_OUTPATIENT_CLINIC_OR_DEPARTMENT_OTHER): Payer: Self-pay

## 2023-02-09 ENCOUNTER — Encounter (HOSPITAL_BASED_OUTPATIENT_CLINIC_OR_DEPARTMENT_OTHER): Payer: Self-pay | Admitting: Student

## 2023-02-09 DIAGNOSIS — M7502 Adhesive capsulitis of left shoulder: Secondary | ICD-10-CM | POA: Diagnosis not present

## 2023-02-09 DIAGNOSIS — S42202D Unspecified fracture of upper end of left humerus, subsequent encounter for fracture with routine healing: Secondary | ICD-10-CM | POA: Diagnosis not present

## 2023-02-09 MED ORDER — TRIAMCINOLONE ACETONIDE 40 MG/ML IJ SUSP
2.0000 mL | INTRAMUSCULAR | Status: AC | PRN
  Administered 2023-02-09: 2 mL via INTRA_ARTICULAR

## 2023-02-09 MED ORDER — LIDOCAINE HCL 1 % IJ SOLN
4.0000 mL | INTRAMUSCULAR | Status: AC | PRN
  Administered 2023-02-09: 4 mL

## 2023-02-09 NOTE — Progress Notes (Signed)
 Chief Complaint: Left shoulder pain     History of Present Illness:   02/09/23: Patient presents today for follow-up of his left shoulder.  He is continue to work with physical therapy on range of motion however continues to have stiffness and pain with terminal ROM.  Rates pain at a 3/10 and denies any use of pain medication.  No numbness or tingling.   12/31/22: [name] is here today for follow-up of his left shoulder which she sustained a proximal humerus fracture on July 4.  Continues to work with physical therapy particularly for range of motion as he has had residual stiffness as a result of immobilization.  PT said this morning that he feels the shoulder is about 60%.  He has not been able to keep up with home exercises as well as he would like lately as he has been taking care of his mom who has been in and out of the hospital.  Surgical History:   None  PMH/PSH/Family History/Social History/Meds/Allergies:   History reviewed. No pertinent past medical history. History reviewed. No pertinent surgical history. Social History   Socioeconomic History   Marital status: Married    Spouse name: Not on file   Number of children: Not on file   Years of education: Not on file   Highest education level: Not on file  Occupational History   Not on file  Tobacco Use   Smoking status: Not on file   Smokeless tobacco: Not on file  Substance and Sexual Activity   Alcohol use: Not on file   Drug use: Not on file   Sexual activity: Not on file  Other Topics Concern   Not on file  Social History Narrative   Not on file   Social Drivers of Health   Financial Resource Strain: Not on file  Food Insecurity: Not on file  Transportation Needs: Not on file  Physical Activity: Not on file  Stress: Not on file  Social Connections: Not on file   History reviewed. No pertinent family history. Allergies  Allergen Reactions   Pseudoephedrine Other (See  Comments)   No current outpatient medications on file.   No current facility-administered medications for this visit.   No results found.  Review of Systems:   A ROS was performed including pertinent positives and negatives as documented in the HPI.  Physical Exam :   Constitutional: NAD and appears stated age Neurological: Alert and oriented Psych: Appropriate affect and cooperative There were no vitals taken for this visit.   Comprehensive Musculoskeletal Exam:    Left shoulder exam demonstrates active range of motion to 120 degrees forward flexion, 30 degrees external rotation, and internal rotation to L5.  Passive shoulder flexion to 140 degrees.  Tenderness over the anterior glenohumeral joint.  Imaging:    Assessment:   68 y.o. male with continued shoulder pain and stiffness as a result of a left proximal humerus fracture sustained on 08/07/2022.  It does appear that as a result he has gone on to develop adhesive capsulitis of the shoulder.  I did discuss this pathology in detail as well as treatment options.  Given that he has been having slow progression with PT, I would recommend a cortisone injection which patient is agreeable to.  Injection was performed the left glenohumeral joint under ultrasound  guidance without any complication.  I would like to have him return in 2 weeks for reassessment and to consider possible repeat injection if needed.  Plan :    -Left shoulder glenohumeral cortisone injection performed today -Return to clinic in 2 weeks     Procedure Note  Patient: Clarence Foster             Date of Birth: 1955-12-28           MRN: [id number]             Visit Date: 02/09/2023  Procedures: Visit Diagnoses:  1. Closed fracture of proximal end of left humerus with routine healing, unspecified fracture morphology, subsequent encounter   2. Adhesive capsulitis of left shoulder     Large Joint Inj: L glenohumeral on 02/09/2023 5:55 PM Indications:  pain Details: 22 G 1.5 in needle, ultrasound-guided anterior approach Medications: 4 mL lidocaine  1 %; 2 mL triamcinolone  acetonide 40 MG/ML Outcome: tolerated well, no immediate complications Procedure, treatment alternatives, risks and benefits explained, specific risks discussed. Consent was given by the patient. Immediately prior to procedure a time out was called to verify the correct patient, procedure, equipment, support staff and site/side marked as required. Patient was prepped and draped in the usual sterile fashion.      I personally saw and evaluated the patient, and participated in the management and treatment plan.  Leonce Reveal, PA-C Orthopedics

## 2023-02-10 DIAGNOSIS — M25512 Pain in left shoulder: Secondary | ICD-10-CM | POA: Diagnosis not present

## 2023-02-12 ENCOUNTER — Ambulatory Visit (HOSPITAL_BASED_OUTPATIENT_CLINIC_OR_DEPARTMENT_OTHER): Payer: Medicare PPO | Admitting: Student

## 2023-02-12 DIAGNOSIS — M25512 Pain in left shoulder: Secondary | ICD-10-CM | POA: Diagnosis not present

## 2023-02-16 DIAGNOSIS — M25512 Pain in left shoulder: Secondary | ICD-10-CM | POA: Diagnosis not present

## 2023-02-23 ENCOUNTER — Ambulatory Visit (INDEPENDENT_AMBULATORY_CARE_PROVIDER_SITE_OTHER): Admitting: Student

## 2023-02-23 ENCOUNTER — Encounter (HOSPITAL_BASED_OUTPATIENT_CLINIC_OR_DEPARTMENT_OTHER): Payer: Self-pay | Admitting: Student

## 2023-02-23 DIAGNOSIS — S42202D Unspecified fracture of upper end of left humerus, subsequent encounter for fracture with routine healing: Secondary | ICD-10-CM | POA: Diagnosis not present

## 2023-02-23 DIAGNOSIS — M7502 Adhesive capsulitis of left shoulder: Secondary | ICD-10-CM

## 2023-02-23 MED ORDER — TRIAMCINOLONE ACETONIDE 40 MG/ML IJ SUSP
2.0000 mL | INTRAMUSCULAR | Status: AC | PRN
  Administered 2023-02-23: 2 mL via INTRA_ARTICULAR

## 2023-02-23 MED ORDER — LIDOCAINE HCL 1 % IJ SOLN
4.0000 mL | INTRAMUSCULAR | Status: AC | PRN
  Administered 2023-02-23: 4 mL

## 2023-02-23 NOTE — Progress Notes (Signed)
Chief Complaint: Left shoulder pain     History of Present Illness:   02/23/23: Patient is here today for follow-up.  Reports that cortisone injection at last visit did give him some relief and he has had slight increase in improvements with physical therapy since.  Still has limitations with shoulder flexion and extension.  Denies taking any pain medications.   02/09/23: Patient presents today for follow-up of his left shoulder.  He is continue to work with physical therapy on range of motion however continues to have stiffness and pain with terminal ROM.  Rates pain at a 3/10 and denies any use of pain medication.  No numbness or tingling.  Surgical History:   None  PMH/PSH/Family History/Social History/Meds/Allergies:   History reviewed. No pertinent past medical history. History reviewed. No pertinent surgical history. Social History   Socioeconomic History   Marital status: Married    Spouse name: Not on file   Number of children: Not on file   Years of education: Not on file   Highest education level: Not on file  Occupational History   Not on file  Tobacco Use   Smoking status: Not on file   Smokeless tobacco: Not on file  Substance and Sexual Activity   Alcohol use: Not on file   Drug use: Not on file   Sexual activity: Not on file  Other Topics Concern   Not on file  Social History Narrative   Not on file   Social Drivers of Health   Financial Resource Strain: Not on file  Food Insecurity: Not on file  Transportation Needs: Not on file  Physical Activity: Not on file  Stress: Not on file  Social Connections: Not on file   History reviewed. No pertinent family history. Allergies  Allergen Reactions   Pseudoephedrine Other (See Comments)   No current outpatient medications on file.   No current facility-administered medications for this visit.   No results found.  Review of Systems:   A ROS was performed including  pertinent positives and negatives as documented in the HPI.  Physical Exam :   Constitutional: NAD and appears stated age Neurological: Alert and oriented Psych: Appropriate affect and cooperative There were no vitals taken for this visit.   Comprehensive Musculoskeletal Exam:    Active range of motion of the left shoulder to 130 degrees, passively to 140.  Tenderness over the anterior glenohumeral joint.  Increased tension noted within the biceps with full range of motion noted of the elbow.  Imaging:    Assessment:   68 y.o. male with shoulder pain and stiffness consistent with adhesive capsulitis due to a proximal humerus fracture sustained 6 months ago.  He did get some relief with glenohumeral cortisone injection at last visit and has been seeing some slow progress in range of motion with physical therapy.  For this reason I did discuss repeating injection today to help continue to progress his relief after which I will not recommend performing another for a minimum of 3 months.  Injection was performed today under ultrasound guidance which he tolerated well.  I would like for him to continue working with physical therapy and he can now return to clinic as needed.   Plan :    -Left shoulder glenohumeral cortisone injection given today -Return to clinic as  needed     Procedure Note  Patient: ROXANA PEAVEY             Date of Birth: [birth year]           MRN: [id number]             Visit Date: 02/23/2023  Procedures: Visit Diagnoses:  1. Closed fracture of proximal end of left humerus with routine healing, unspecified fracture morphology, subsequent encounter   2. Adhesive capsulitis of left shoulder     Large Joint Inj: L glenohumeral on 02/23/2023 3:42 PM Indications: pain Details: 22 G 1.5 in needle, ultrasound-guided anterior approach Medications: 4 mL lidocaine 1 %; 2 mL triamcinolone acetonide 40 MG/ML Outcome: tolerated well, no immediate complications Procedure,  treatment alternatives, risks and benefits explained, specific risks discussed. Consent was given by the patient. Immediately prior to procedure a time out was called to verify the correct patient, procedure, equipment, support staff and site/side marked as required. Patient was prepped and draped in the usual sterile fashion.     I personally saw and evaluated the patient, and participated in the management and treatment plan.  Hazle Nordmann, PA-C Orthopedics

## 2023-02-24 DIAGNOSIS — M25512 Pain in left shoulder: Secondary | ICD-10-CM | POA: Diagnosis not present

## 2023-02-26 DIAGNOSIS — M25512 Pain in left shoulder: Secondary | ICD-10-CM | POA: Diagnosis not present

## 2023-03-02 DIAGNOSIS — M25512 Pain in left shoulder: Secondary | ICD-10-CM | POA: Diagnosis not present

## 2023-03-04 DIAGNOSIS — M25512 Pain in left shoulder: Secondary | ICD-10-CM | POA: Diagnosis not present

## 2023-03-09 DIAGNOSIS — M25512 Pain in left shoulder: Secondary | ICD-10-CM | POA: Diagnosis not present

## 2023-03-16 DIAGNOSIS — M25512 Pain in left shoulder: Secondary | ICD-10-CM | POA: Diagnosis not present

## 2023-03-23 DIAGNOSIS — M25512 Pain in left shoulder: Secondary | ICD-10-CM | POA: Diagnosis not present

## 2023-03-30 DIAGNOSIS — M25512 Pain in left shoulder: Secondary | ICD-10-CM | POA: Diagnosis not present

## 2023-04-17 DIAGNOSIS — J0140 Acute pansinusitis, unspecified: Secondary | ICD-10-CM | POA: Diagnosis not present

## 2023-05-20 ENCOUNTER — Ambulatory Visit (HOSPITAL_BASED_OUTPATIENT_CLINIC_OR_DEPARTMENT_OTHER): Admitting: Student

## 2023-05-20 ENCOUNTER — Encounter (HOSPITAL_BASED_OUTPATIENT_CLINIC_OR_DEPARTMENT_OTHER): Payer: Self-pay | Admitting: Student

## 2023-05-20 ENCOUNTER — Ambulatory Visit (INDEPENDENT_AMBULATORY_CARE_PROVIDER_SITE_OTHER)

## 2023-05-20 DIAGNOSIS — M722 Plantar fascial fibromatosis: Secondary | ICD-10-CM | POA: Diagnosis not present

## 2023-05-20 DIAGNOSIS — M79672 Pain in left foot: Secondary | ICD-10-CM

## 2023-05-20 DIAGNOSIS — M7732 Calcaneal spur, left foot: Secondary | ICD-10-CM | POA: Diagnosis not present

## 2023-05-20 DIAGNOSIS — M7662 Achilles tendinitis, left leg: Secondary | ICD-10-CM | POA: Diagnosis not present

## 2023-05-20 NOTE — Progress Notes (Signed)
 Chief Complaint: Left heel pain     History of Present Illness:    SHAKA CARDIN is a pleasant 68 y.o. male presenting to clinic today for 30-month history of left heel pain.  Patient states that he does have history of plantar fasciitis in the right foot, and that current symptoms feel similar.  He did undergo multiple cortisone injections on the right side back in 2023 with Dr. Cleophas Dunker.  Pain is worse with weightbearing, particularly first thing in the morning or if he has to get up in the middle the night.  He has tried gel orthotic inserts, ice, massage, and stretching at home.  Denies any issues in the right foot at this time.   Surgical History:   None  PMH/PSH/Family History/Social History/Meds/Allergies:   History reviewed. No pertinent past medical history. History reviewed. No pertinent surgical history. Social History   Socioeconomic History   Marital status: Married    Spouse name: Not on file   Number of children: Not on file   Years of education: Not on file   Highest education level: Not on file  Occupational History   Not on file  Tobacco Use   Smoking status: Not on file   Smokeless tobacco: Not on file  Substance and Sexual Activity   Alcohol use: Not on file   Drug use: Not on file   Sexual activity: Not on file  Other Topics Concern   Not on file  Social History Narrative   Not on file   Social Drivers of Health   Financial Resource Strain: Not on file  Food Insecurity: Not on file  Transportation Needs: Not on file  Physical Activity: Not on file  Stress: Not on file  Social Connections: Not on file   History reviewed. No pertinent family history. Allergies  Allergen Reactions   Pseudoephedrine Other (See Comments)   No current outpatient medications on file.   No current facility-administered medications for this visit.   No results found.  Review of Systems:   A ROS was performed including pertinent  positives and negatives as documented in the HPI.  Physical Exam :   Constitutional: NAD and appears stated age Neurological: Alert and oriented Psych: Appropriate affect and cooperative There were no vitals taken for this visit.   Comprehensive Musculoskeletal Exam:    Tenderness with palpation along the plantar aspect of the calcaneus extending distally throughout the distribution of the plantar fascia.  Arch of the foot is well-maintained.  No pain noted with passive extension of the toes.  Patient is ambulating with normal gait.  Imaging:   Xray (left os calcis 2 views): Small plantar calcaneal spur but otherwise negative for bony abnormality   I personally reviewed and interpreted the radiographs.   Assessment:   68 y.o. male with 29-month history of left heel pain which appears very consistent with plantar fasciitis.  Patient does have a history of this in the right foot which significantly improved with cortisone injections.  Given that he has already failed some conservative therapies, I have recommended getting him into see Dr. Madelyn Brunner to determine if he would be a good candidate for a repeat cortisone injection versus potential shockwave therapy.  Patient is in agreement with plan, and I can plan to follow-up with him at any point  if needed.  Plan :    - Referral to Dr. Vaughn Georges for evaluation and potential shockwave therapy versus cortisone injection - Return to clinic as needed     I personally saw and evaluated the patient, and participated in the management and treatment plan.  Sharrell Deck, PA-C Orthopedics

## 2023-05-28 DIAGNOSIS — N401 Enlarged prostate with lower urinary tract symptoms: Secondary | ICD-10-CM | POA: Diagnosis not present

## 2023-05-28 DIAGNOSIS — G4700 Insomnia, unspecified: Secondary | ICD-10-CM | POA: Diagnosis not present

## 2023-05-28 DIAGNOSIS — Z125 Encounter for screening for malignant neoplasm of prostate: Secondary | ICD-10-CM | POA: Diagnosis not present

## 2023-05-28 DIAGNOSIS — H8110 Benign paroxysmal vertigo, unspecified ear: Secondary | ICD-10-CM | POA: Diagnosis not present

## 2023-05-28 DIAGNOSIS — Z0000 Encounter for general adult medical examination without abnormal findings: Secondary | ICD-10-CM | POA: Diagnosis not present

## 2023-05-28 DIAGNOSIS — L300 Nummular dermatitis: Secondary | ICD-10-CM | POA: Diagnosis not present

## 2023-05-28 DIAGNOSIS — E7800 Pure hypercholesterolemia, unspecified: Secondary | ICD-10-CM | POA: Diagnosis not present

## 2023-05-28 DIAGNOSIS — M545 Low back pain, unspecified: Secondary | ICD-10-CM | POA: Diagnosis not present

## 2023-05-28 DIAGNOSIS — K219 Gastro-esophageal reflux disease without esophagitis: Secondary | ICD-10-CM | POA: Diagnosis not present

## 2023-05-28 DIAGNOSIS — Z1331 Encounter for screening for depression: Secondary | ICD-10-CM | POA: Diagnosis not present

## 2023-06-08 ENCOUNTER — Other Ambulatory Visit: Payer: Self-pay

## 2023-06-08 ENCOUNTER — Encounter: Payer: Self-pay | Admitting: Sports Medicine

## 2023-06-08 ENCOUNTER — Ambulatory Visit (INDEPENDENT_AMBULATORY_CARE_PROVIDER_SITE_OTHER): Admitting: Sports Medicine

## 2023-06-08 DIAGNOSIS — Q6671 Congenital pes cavus, right foot: Secondary | ICD-10-CM | POA: Diagnosis not present

## 2023-06-08 DIAGNOSIS — M7732 Calcaneal spur, left foot: Secondary | ICD-10-CM | POA: Diagnosis not present

## 2023-06-08 DIAGNOSIS — M722 Plantar fascial fibromatosis: Secondary | ICD-10-CM

## 2023-06-08 DIAGNOSIS — Q6672 Congenital pes cavus, left foot: Secondary | ICD-10-CM | POA: Diagnosis not present

## 2023-06-08 MED ORDER — BUPIVACAINE HCL 0.25 % IJ SOLN
2.0000 mL | INTRAMUSCULAR | Status: AC | PRN
  Administered 2023-06-08: 2 mL

## 2023-06-08 MED ORDER — BETAMETHASONE SOD PHOS & ACET 6 (3-3) MG/ML IJ SUSP
6.0000 mg | INTRAMUSCULAR | Status: AC | PRN
  Administered 2023-06-08: 6 mg via INTRA_ARTICULAR

## 2023-06-08 NOTE — Progress Notes (Signed)
 Patient was instructed in 10 minutes of therapeutic exercises for left heel to improve strength, ROM and function according to my instructions and plan of care by a Certified Athletic Trainer during the office visit. A customized handout was provided and demonstration of proper technique shown and discussed. Patient did perform exercises and demonstrate understanding through teachback.  All questions discussed and answered.

## 2023-06-08 NOTE — Progress Notes (Signed)
 Clarence Foster - 68 y.o. male MRN [id number]  Date of birth: 12-Feb-1955  Office Visit Note: Visit Date: 06/08/2023 PCP: Rae Bugler, MD Referred by: Amedeo Jupiter, PA*  Subjective: Chief Complaint  Patient presents with   Left Heel - Pain   HPI: Clarence Foster is a pleasant 68 y.o. male who presents today for left heel/plantar fascia pain for 3-4 months.  He has a notable history of plantar fasciitis on the right foot.  In years past he has had several injections for this side, but never the left side.  He has used orthotics in the past to support his foot shape but states these current ones are quite outdated.  He also uses heel cups/silicone padding.  He is unable to take consistent NSAIDs given his kidney disease, has used topical Voltaren gel with minimal relief.  Pertinent ROS were reviewed with the patient and found to be negative unless otherwise specified above in HPI.   Assessment & Plan: Visit Diagnoses:  1. Plantar fasciitis, left   2. Heel spur, left   3. Plantar fasciitis of right foot   4. Pes cavus of both feet    Plan: Impression is symptomatic left foot plantar fasciitis which has persisted for the last 3-4 months.  He has a history of contralateral plantar fasciitis on the right side as well, in the setting of his pes cavus of both feet which is likely predisposing to this condition.  Through shared decision making, we did proceed with ultrasound-guided left foot plantar fascia injection, patient tolerated well.  Advised on postinjection protocol.  May use ice or Tylenol as needed for postinjection pain.  Starting after 48 hours of modified rest/activity, he may begin therapy for his plantar fascia.  He prefers home versus formalized PT.  We did print out a customized handout for his plantar fascia and foot stability, my athletic trainer Lonzell Robin did review these with him in the room today.  He will perform these once daily starting Thursday AM consistently x 1 month.   We also got him into orthotics to help support his pes cavus and offload the plantar fascia, these were placed in the shoes today.  I would like to see him back in about 1 month for reevaluation.  We did discuss if he does not have full relief, we could consider extracorporeal shockwave therapy, this was demonstrated for him today.  Follow-up: Return in about 1 month (around 07/09/2023) for Right plantar fascia .   Meds & Orders: No orders of the defined types were placed in this encounter.   Orders Placed This Encounter  Procedures   Foot Inj   US  Guided Needle Placement - No Linked Charges     Procedures: Foot Inj: left plantar fascia  Date/Time: 06/08/2023 5:22 PM  Performed by: Shauna Del, DO Authorized by: Shauna Del, DO   Consent Given by:  Patient Site marked: the procedure site was marked   Timeout: prior to procedure the correct patient, procedure, and site was verified   Indications:  Fasciitis and pain Condition: Plantar Fasciitis   Location: left plantar fascia muscle   Prep: patient was prepped and draped in usual sterile fashion   Needle Size:  22 G Medications:  2 mL bupivacaine 0.25 %; 6 mg betamethasone acetate-betamethasone sodium phosphate 6 (3-3) MG/ML Patient Tolerance:  Patient tolerated the procedure well with no immediate complications  Procedure: Plantar fasciitis injection, Left After discussion on R/B/I and informed verbal consent was obtained, a  timeout was performed. Patient was lying prone on exam table. Medial aspect of inferior calcaneus was identified with point of maximum tenderness and area was cleaned with chloraprep and alcohol swab. Area was first anesthesized with 2cc of lidocaine  1%. After appropriate analgesia, utilizing ultrasound guidance via an in-plane approach, the patient's plantar fasica of left foot was injected with 2:1 bupivicaine:betamethasone with multiple needle fenestrations from a medial to lateral in-plane approach. Patient  tolerated procedure well without immediate complications.        Clinical History: No specialty comments available.  He has no history on file for tobacco use. No results for input(s): "HGBA1C", "LABURIC" in the last 8760 hours.  Objective:    Physical Exam  Gen: Well-appearing, in no acute distress; non-toxic CV: Well-perfused. Warm.  Resp: Breathing unlabored on room air; no wheezing. Psych: Fluid speech in conversation; appropriate affect; normal thought process  Ortho Exam - Bilateral feet: There is mild pes cavus foot shape bilaterally.  There is pain with for steps on the left heel.  Positive TTP over the medial band of the plantar fascia near the calcaneal insertion.  No redness swelling or effusion about the ankle joint.  Imaging:  *Independent review and interpretation of 2 view left heel x-ray from 05/20/2023 was performed by myself today.  There is mild pes cavus noted on lateral film.  There is a very small plantar calcaneal spur as well as minimal enthesophyte change at the insertion of the distal Achilles.  No acute fracture noted.  DG Os Calcis Left CLINICAL DATA:  Left heel pain.  EXAM: LEFT OS CALCIS - 2+ VIEW  COMPARISON:  None Available.  FINDINGS: Small plantar calcaneal spur. Diminutive Achilles tendon enthesophyte. No fracture. No erosion or focal bone abnormality. Normal subtalar alignment. Vascular calcifications are seen.  IMPRESSION: Small plantar calcaneal spur. Diminutive Achilles tendon enthesophyte.  Electronically Signed   By: Chadwick Colonel M.D.   On: 05/20/2023 10:28  Past Medical/Family/Surgical/Social History: Medications & Allergies reviewed per EMR, new medications updated. Patient Active Problem List   Diagnosis Date Noted   Plantar fasciitis of right foot 09/05/2021   History reviewed. No pertinent past medical history. History reviewed. No pertinent family history. History reviewed. No pertinent surgical history. Social  History   Occupational History   Not on file  Tobacco Use   Smoking status: Not on file   Smokeless tobacco: Not on file  Substance and Sexual Activity   Alcohol use: Not on file   Drug use: Not on file   Sexual activity: Not on file

## 2023-06-10 ENCOUNTER — Encounter (INDEPENDENT_AMBULATORY_CARE_PROVIDER_SITE_OTHER): Payer: Self-pay | Admitting: Otolaryngology

## 2023-06-10 DIAGNOSIS — N401 Enlarged prostate with lower urinary tract symptoms: Secondary | ICD-10-CM | POA: Diagnosis not present

## 2023-06-10 DIAGNOSIS — R350 Frequency of micturition: Secondary | ICD-10-CM | POA: Diagnosis not present

## 2023-07-07 ENCOUNTER — Encounter: Payer: Self-pay | Admitting: Sports Medicine

## 2023-07-07 ENCOUNTER — Ambulatory Visit (INDEPENDENT_AMBULATORY_CARE_PROVIDER_SITE_OTHER): Admitting: Sports Medicine

## 2023-07-07 DIAGNOSIS — Q6672 Congenital pes cavus, left foot: Secondary | ICD-10-CM

## 2023-07-07 DIAGNOSIS — M722 Plantar fascial fibromatosis: Secondary | ICD-10-CM | POA: Diagnosis not present

## 2023-07-07 DIAGNOSIS — Q6671 Congenital pes cavus, right foot: Secondary | ICD-10-CM | POA: Diagnosis not present

## 2023-07-07 DIAGNOSIS — M7732 Calcaneal spur, left foot: Secondary | ICD-10-CM

## 2023-07-07 NOTE — Progress Notes (Signed)
 Clarence Foster - 68 y.o. male MRN [id number]  Date of birth: 1955/11/11  Office Visit Note: Visit Date: 07/07/2023 PCP: Clarence Bugler, MD Referred by: Clarence Bugler, MD  Subjective: Chief Complaint  Patient presents with   Right Foot - Follow-up   Left Foot - Follow-up   HPI: Clarence Foster is a pleasant 68 y.o. male who presents today for follow-up of left heel/plantar fasciitis.  Back on 06/08/2023 we did proceed with ultrasound-guided plantar fascia injection, this gave him good relief of his pain but did not fully resolve his pain.  He has done some of his exercises at work but more from a standing position with stretching and has not been as adherent to some as he knows he should.  Discussed this with me today he cannot take NSAIDs given his kidney disease from recommendation from his PCP, I would like him to dive into this further.  He has continued in his orthotics and is wearing a gel cushion for the heel.  Pertinent ROS were reviewed with the patient and found to be negative unless otherwise specified above in HPI.   Assessment & Plan: Visit Diagnoses:  1. Plantar fasciitis, left   2. Heel spur, left   3. Pes cavus of both feet    Plan: Impression is improved but not resolved left foot plantar fasciitis which did receive good relief from prior plantar fascia injection.  To help further augment his healing and pain response, we did proceed with a trial of extracorporeal shockwave therapy, patient tolerated well.  I would like to proceed with 2 additional treatments and then see what sort of cumulative benefit he has going forward.  He has not been as adherent to his exercise/therapy regimen as I would like, we did reiterate this today and he will perform these once daily.  He will continue in his orthotics and may use topical Voltaren or Tylenol as needed.  I will see him back over the next week or so for repeat shockwave and further evaluation.  I would like him to reach out to his  primary physician regarding intermittent use of NSAIDs to see if this is something we can do only short-term in the future if needed  Follow-up: Return for schedule 2 appts 1-week apart for R-PF (reg visit).   Meds & Orders: No orders of the defined types were placed in this encounter.  No orders of the defined types were placed in this encounter.    Procedures: Procedure: ECSWT Indications: Plantar fasciitis   Procedure Details Consent: Risks of procedure as well as the alternatives and risks of each were explained to the patient.  Verbal consent for procedure obtained. Time Out: Verified patient identification, verified procedure, site was marked, verified correct patient position. The area was cleaned with alcohol swab.     The left plantar fascia was targeted for Extracorporeal shockwave therapy.    Preset: Plantar fasciitis Power Level: 90 mJ Frequency: 10 Hz Impulse/cycles: 2750 Head size: Regular   Patient tolerated procedure well without immediate complications.         Clinical History: No specialty comments available.  He has no history on file for tobacco use. No results for input(s): "HGBA1C", "LABURIC" in the last 8760 hours.  Objective:    Physical Exam  Gen: Well-appearing, in no acute distress; non-toxic CV: Well-perfused. Warm.  Resp: Breathing unlabored on room air; no wheezing. Psych: Fluid speech in conversation; appropriate affect; normal thought process  Ortho Exam - Left  foot: Preserved longitudinal arch.  Positive TTP but improved at the medial plantar fascia insertion and directly over the plantar aspect of his calcaneal heel spur.  There is no redness swelling or effusion.  Imaging: No results found.  Past Medical/Family/Surgical/Social History: Medications & Allergies reviewed per EMR, new medications updated. Patient Active Problem List   Diagnosis Date Noted   Plantar fasciitis of right foot 09/05/2021   No past medical history on  file. No family history on file. No past surgical history on file. Social History   Occupational History   Not on file  Tobacco Use   Smoking status: Not on file   Smokeless tobacco: Not on file  Substance and Sexual Activity   Alcohol use: Not on file   Drug use: Not on file   Sexual activity: Not on file

## 2023-07-07 NOTE — Progress Notes (Signed)
 Patient says that for a week or two he got pretty good relief from the injection - he felt about 50-60% improved. Since then, his pain has gradually returned, and now feels the same as it did prior to the injection. He is beginning to feel some of this pain in the right heel as well, but it is not as intense. He has been doing some of his exercises regularly. He is inquiring about another injection today, if that might give him good or long-lasting relief.

## 2023-07-16 ENCOUNTER — Ambulatory Visit: Admitting: Sports Medicine

## 2023-07-16 ENCOUNTER — Encounter: Payer: Self-pay | Admitting: Sports Medicine

## 2023-07-16 DIAGNOSIS — M7732 Calcaneal spur, left foot: Secondary | ICD-10-CM | POA: Diagnosis not present

## 2023-07-16 DIAGNOSIS — Q6672 Congenital pes cavus, left foot: Secondary | ICD-10-CM | POA: Diagnosis not present

## 2023-07-16 DIAGNOSIS — M722 Plantar fascial fibromatosis: Secondary | ICD-10-CM

## 2023-07-16 DIAGNOSIS — Q6671 Congenital pes cavus, right foot: Secondary | ICD-10-CM | POA: Diagnosis not present

## 2023-07-16 MED ORDER — MELOXICAM 15 MG PO TABS: 15.0000 mg | ORAL_TABLET | Freq: Every day | ORAL | 0 refills | Status: AC

## 2023-07-16 NOTE — Progress Notes (Signed)
 Clarence Foster - 68 y.o. male MRN [id number]  Date of birth: 1955-09-28  Office Visit Note: Visit Date: 07/16/2023 PCP: Rae Bugler, MD Referred by: Rae Bugler, MD  Subjective: Chief Complaint  Patient presents with   Left Foot - Follow-up   HPI: Clarence Foster is a pleasant 68 y.o. male who presents today for follow-up of chronic left foot.   Edu tolerated our first shockwave treatment well.  He did have some soreness for about the first 1.5 days, but overall is feeling better this morning.  He has been adherent with his home exercise regimen.  He did talk with his PCP who confirmed he could take a short course of anti-inflammatories if needed.  Pertinent ROS were reviewed with the patient and found to be negative unless otherwise specified above in HPI.   Assessment & Plan: Visit Diagnoses:  1. Plantar fasciitis, left   2. Heel spur, left   3. Pes cavus of both feet    Plan: Impression is chronic left foot pain with both concomitant plantar fasciitis as well as a calcaneal heel spur. This is in the setting of pes cavus of both of his feet, which we did fit him for orthotics to help support the arch and offload the plantar fascia.  We did repeat extracorporeal shockwave therapy and will provide 1 additional treatment and see what sort of cumulative benefit he has going forward before deciding on additional treatments. I would like to start a short course of meloxicam 15 mg to take daily scheduled for about 2 weeks and then can transition to as needed.  He will continue his HEP and I will see him back next week for repeat shockwave and further evaluation.  Follow-up: Return in about 1 week (around 07/23/2023).   Meds & Orders:  Meds ordered this encounter  Medications   meloxicam (MOBIC) 15 MG tablet    Sig: Take 1 tablet (15 mg total) by mouth daily.    Dispense:  30 tablet    Refill:  0   No orders of the defined types were placed in this encounter.     Procedures: Procedure: ECSWT Indications: Plantar fasciitis   Procedure Details Consent: Risks of procedure as well as the alternatives and risks of each were explained to the patient.  Verbal consent for procedure obtained. Time Out: Verified patient identification, verified procedure, site was marked, verified correct patient position. The area was cleaned with alcohol swab.     The left plantar fascia was targeted for Extracorporeal shockwave therapy.    Preset: Plantar fasciitis Power Level: 100 mJ  Frequency: 11 Hz Impulse/cycles: 3000 Head size: Regular   Patient tolerated procedure well without immediate complications.      Clinical History: No specialty comments available.  He has no history on file for tobacco use. No results for input(s): HGBA1C, LABURIC in the last 8760 hours.  Objective:    Physical Exam  Gen: Well-appearing, in no acute distress; non-toxic CV: Well-perfused. Warm.  Resp: Breathing unlabored on room air; no wheezing. Psych: Fluid speech in conversation; appropriate affect; normal thought process  Ortho Exam - Left foot: + Mild TTP over the medial band of the plantar fascia and the plantar calcaneal region [place] his heel spur.  There is no redness swelling or effusion.  Imaging: No results found.  Past Medical/Family/Surgical/Social History: Medications & Allergies reviewed per EMR, new medications updated. Patient Active Problem List   Diagnosis Date Noted   Plantar fasciitis of right  foot 09/05/2021   History reviewed. No pertinent past medical history. History reviewed. No pertinent family history. History reviewed. No pertinent surgical history. Social History   Occupational History   Not on file  Tobacco Use   Smoking status: Not on file   Smokeless tobacco: Not on file  Substance and Sexual Activity   Alcohol use: Not on file   Drug use: Not on file   Sexual activity: Not on file

## 2023-07-16 NOTE — Progress Notes (Signed)
 Patient says that he is doing about the same. He did have some soreness/tenderness a couple of days after the shockwave therapy. He is feeling better this morning. He has been doing his exercises daily and those have been feeling okay. He also contacted his PCP, who confirmed that a short course of anti inflammatories would be okay based on his recent kidney labs.

## 2023-07-23 ENCOUNTER — Encounter: Payer: Self-pay | Admitting: Sports Medicine

## 2023-07-23 ENCOUNTER — Ambulatory Visit: Admitting: Sports Medicine

## 2023-07-23 DIAGNOSIS — Q6672 Congenital pes cavus, left foot: Secondary | ICD-10-CM | POA: Diagnosis not present

## 2023-07-23 DIAGNOSIS — M7732 Calcaneal spur, left foot: Secondary | ICD-10-CM

## 2023-07-23 DIAGNOSIS — M722 Plantar fascial fibromatosis: Secondary | ICD-10-CM

## 2023-07-23 DIAGNOSIS — Q6671 Congenital pes cavus, right foot: Secondary | ICD-10-CM

## 2023-07-23 NOTE — Progress Notes (Signed)
 Patient says that after the second shockwave therapy, he was sore for a couple of days, felt better for a couple of days, and has had soreness again. He describes his current soreness as the same as it was prior to the shockwave therapy. He is doing his exercises daily. He says that he went to [employer] to try on new shoes, and has ordered a pair of Hokas. They have not arrived, but he is hopeful that they will help.

## 2023-07-23 NOTE — Progress Notes (Signed)
 Clarence Foster - 68 y.o. male MRN [id number]  Date of birth: 1955-10-04  Office Visit Note: Visit Date: 07/23/2023 PCP: Rae Bugler, MD Referred by: Rae Bugler, MD  Subjective: Chief Complaint  Patient presents with   Left Foot - Follow-up   HPI: Clarence Foster is a pleasant 68 y.o. male who presents today for follow-up of left chronic foot pain with plantar fasciitis, heel spur and pes cavus of both feet.  We have performed 2 treatments of extracorporeal shockwave therapy.  He did have some soreness following this treatment but does feel like he is noticing some improvements.  He recently was fitted at a shoe store for [employer] and did order these but has not arrived yet.  He continues in his orthotic insoles in his heel cushion.  He is performing his home exercises at times as well.  Did start meloxicam  15 mg almost 1 week ago, is taking this once daily.  Pertinent ROS were reviewed with the patient and found to be negative unless otherwise specified above in HPI.   Assessment & Plan: Visit Diagnoses:  1. Plantar fasciitis, left   2. Heel spur, left   3. Pes cavus of both feet    Plan: Impression is chronic left foot and heel pain with concomitant plantar fasciitis as well as a calcaneal heel spur that both seem to be symptomatic.  He does have pes cavus of both feet that predisposes him to his conditions, this did improve with orthotic insoles which he will continue wearing in his shoes.  We did repeat extracorporeal shockwave therapy today, patient tolerated well.  He will continue his meloxicam  15 mg for a total of 2 weeks and then will transition to only as needed.  I would like him to send me a MyChart message in 1 week give me an update on his degree of improvement from 0-100%.  Depending on his degree of improvement, we may consider a few additional shockwave treatments versus additional treatment options. Could consider US -guided PF injection if necessary going  forward.  Follow-up: Return for will send Mychart message in 1-week of improvement/update.   Meds & Orders: No orders of the defined types were placed in this encounter.  No orders of the defined types were placed in this encounter.    Procedures: Procedure: ECSWT Indications: Plantar fasciitis   Procedure Details Consent: Risks of procedure as well as the alternatives and risks of each were explained to the patient.  Verbal consent for procedure obtained. Time Out: Verified patient identification, verified procedure, site was marked, verified correct patient position. The area was cleaned with alcohol swab.     The left plantar fascia was targeted for Extracorporeal shockwave therapy.    Preset: Plantar fasciitis Power Level: 100 mJ  Frequency: 11 Hz Impulse/cycles: 3000 Head size: Regular   Patient tolerated procedure well without immediate complications.      Clinical History: No specialty comments available.  He has no history on file for tobacco use. No results for input(s): HGBA1C, LABURIC in the last 8760 hours.  Objective:    Physical Exam  Gen: Well-appearing, in no acute distress; non-toxic CV: Well-perfused. Warm.  Resp: Breathing unlabored on room air; no wheezing. Psych: Fluid speech in conversation; appropriate affect; normal thought process  Ortho Exam - Left foot: There is pes cavus noted, good neutral ankle positioning with orthotic insole.  There is mild TTP over the medial band of the plantar fascia and over the posterior calcaneus.  No swelling.  Imaging: No results found.  Past Medical/Family/Surgical/Social History: Medications & Allergies reviewed per EMR, new medications updated. Patient Active Problem List   Diagnosis Date Noted   Plantar fasciitis of right foot 09/05/2021   No past medical history on file. No family history on file. No past surgical history on file. Social History   Occupational History   Not on file  Tobacco Use    Smoking status: Not on file   Smokeless tobacco: Not on file  Substance and Sexual Activity   Alcohol use: Not on file   Drug use: Not on file   Sexual activity: Not on file

## 2023-07-28 ENCOUNTER — Telehealth: Payer: Self-pay | Admitting: Sports Medicine

## 2023-07-28 NOTE — Telephone Encounter (Signed)
 Patient called. Says he is still in pain. His cb# [phone]

## 2023-07-30 ENCOUNTER — Ambulatory Visit: Admitting: Sports Medicine

## 2023-07-30 ENCOUNTER — Encounter: Payer: Self-pay | Admitting: Sports Medicine

## 2023-07-30 DIAGNOSIS — Q6672 Congenital pes cavus, left foot: Secondary | ICD-10-CM

## 2023-07-30 DIAGNOSIS — M722 Plantar fascial fibromatosis: Secondary | ICD-10-CM

## 2023-07-30 DIAGNOSIS — M7732 Calcaneal spur, left foot: Secondary | ICD-10-CM | POA: Diagnosis not present

## 2023-07-30 DIAGNOSIS — Q6671 Congenital pes cavus, right foot: Secondary | ICD-10-CM

## 2023-07-30 MED ORDER — METHYLPREDNISOLONE 4 MG PO TBPK: ORAL_TABLET | ORAL | 0 refills | Status: AC

## 2023-07-30 NOTE — Progress Notes (Signed)
 Patient says that he is feeling a bit better this morning than he did when updating over the phone yesterday. He says that his wife did notice some swelling over the heel yesterday. He has been wearing his new [employer]oka sneakers, and although he tries to limit the pressure he puts through his heel, these shoes do seem to help.

## 2023-07-30 NOTE — Progress Notes (Signed)
 Clarence Foster - 68 y.o. male MRN [id number]  Date of birth: 05-13-1955  Office Visit Note: Visit Date: 07/30/2023 PCP: Seabron Lenis, MD Referred by: Seabron Lenis, MD  Subjective: Chief Complaint  Patient presents with   Left Foot - Follow-up   HPI: Clarence Foster is a pleasant 68 y.o. male who presents today for follow-up of A/C left heel pain and PF.  Clarence Foster is having some improvement but still is having rather significant heel pain.  We did perform 3 treatments of shockwave therapy and he feels about 25% better but certainly not significant improvement.  He has been wearing new [employer]oka shoes and limiting pressure through the heel which has helped to a degree.  He is wearing his orthotics as well.  Feels pain over the medial aspect of the calcaneus but also directly underneath.  Pertinent ROS were reviewed with the patient and found to be negative unless otherwise specified above in HPI.   Assessment & Plan: Visit Diagnoses:  1. Plantar fasciitis, left   2. Heel spur, left   3. Pes cavus of both feet    - 2 Dx with exacerbation, Rx management  Plan: Impression is acute on chronic left heel pain with an exacerbation of his plantar fasciitis as well as a symptomatic left heel spur in the setting of pes cavus foot shape.  He has received partial relief from shockwave therapy as well as HEP and shoe modification with insoles.  Here recently though he has had swelling which is driving his inflammatory burden and pain.  We will start him on a 6-day Medrol  Dosepak to reduce this.  I would like him to transition to a short cam walker boot for the next 12 days, this was fitted for him in the room today.  He will send me a message in 2 weeks to notify me the percentage (0-100%) of improvement.  If he is doing much better, we may continue his treatment and possibly a few additional shockwave treatments.  If he is still struggling, we could consider MRI of the ankle to evaluate the plantar fascia and  other soft tissue structures we cannot evaluate on x-ray.   Follow-up: May update/message me in 2 weeks for improvement   Meds & Orders:  Meds ordered this encounter  Medications   methylPREDNISolone  (MEDROL  DOSEPAK) 4 MG TBPK tablet    Sig: Take per packet instruction. Taper dosing.    Dispense:  1 each    Refill:  0   No orders of the defined types were placed in this encounter.    Procedures: No procedures performed      Clinical History: No specialty comments available.  He has no history on file for tobacco use. No results for input(s): HGBA1C, LABURIC in the last 8760 hours.  Objective:    Physical Exam  Gen: Well-appearing, in no acute distress; non-toxic CV: Well-perfused. Warm.  Resp: Breathing unlabored on room air; no wheezing. Psych: Fluid speech in conversation; appropriate affect; normal thought process  Ortho Exam - Left foot: Notable pes cavus but neutral ankle positioning within his shoe and orthotic.  There is positive TTP over the medial band of the insertional plantar fascia as well as directly over the plantar aspect of the calcaneus near his heel spur.  There is a degree of soft tissue swelling over the medial plantar heel as well.  No effusion of the ankle joint.  Imaging: No results found.  Past Medical/Family/Surgical/Social History: Medications & Allergies reviewed  per EMR, new medications updated. Patient Active Problem List   Diagnosis Date Noted   Plantar fasciitis of right foot 09/05/2021   History reviewed. No pertinent past medical history. History reviewed. No pertinent family history. History reviewed. No pertinent surgical history. Social History   Occupational History   Not on file  Tobacco Use   Smoking status: Not on file   Smokeless tobacco: Not on file  Substance and Sexual Activity   Alcohol use: Not on file   Drug use: Not on file   Sexual activity: Not on file

## 2023-08-18 ENCOUNTER — Other Ambulatory Visit: Payer: Self-pay | Admitting: Sports Medicine

## 2023-08-18 DIAGNOSIS — M7732 Calcaneal spur, left foot: Secondary | ICD-10-CM

## 2023-08-18 DIAGNOSIS — M722 Plantar fascial fibromatosis: Secondary | ICD-10-CM

## 2023-08-18 DIAGNOSIS — Z203 Contact with and (suspected) exposure to rabies: Secondary | ICD-10-CM | POA: Diagnosis not present

## 2023-08-18 NOTE — Telephone Encounter (Signed)
 MRI order sent

## 2023-08-19 ENCOUNTER — Encounter: Payer: Self-pay | Admitting: Sports Medicine

## 2023-08-20 ENCOUNTER — Encounter: Payer: Self-pay | Admitting: Sports Medicine

## 2023-08-21 ENCOUNTER — Ambulatory Visit
Admission: RE | Admit: 2023-08-21 | Discharge: 2023-08-21 | Disposition: A | Discharge status: Home / Self Care | Source: Ambulatory Visit

## 2023-08-21 VITALS — BP 100/68 | HR 73 | Temp 98.8°F | Resp 16

## 2023-08-21 DIAGNOSIS — Z203 Contact with and (suspected) exposure to rabies: Secondary | ICD-10-CM

## 2023-08-21 DIAGNOSIS — Z23 Encounter for immunization: Secondary | ICD-10-CM

## 2023-08-21 HISTORY — DX: Pure hypercholesterolemia, unspecified: E78.00

## 2023-08-21 MED ORDER — RABIES VACCINE, PCEC IM SUSR
1.0000 mL | Freq: Once | INTRAMUSCULAR | Status: AC
  Administered 2023-08-21: 1 mL via INTRAMUSCULAR

## 2023-08-21 NOTE — ED Provider Notes (Signed)
  [facility]  Note:  This document was prepared using Conservation officer, historic buildings and may include unintentional dictation errors.  MRN: [id number] DOB: 02/11/1955  Subjective:   Clarence Foster is a 68 y.o. male presenting for rabies vaccination. He is currently on Day 3. He is right on schedule.  P[facility] [place] confirming his treatment.  No current facility-administered medications for this encounter.  Current Outpatient Medications:    amoxicillin-clavulanate (AUGMENTIN) 875-125 MG tablet, Take 1 tablet by mouth 2 (two) times daily., Disp: , Rfl:    meloxicam  (MOBIC ) 15 MG tablet, Take 1 tablet (15 mg total) by mouth daily., Disp: 30 tablet, Rfl: 0   methylPREDNISolone  (MEDROL  DOSEPAK) 4 MG TBPK tablet, Take per packet instruction. Taper dosing., Disp: 1 each, Rfl: 0   rosuvastatin (CRESTOR) 10 MG tablet, Take 10 mg by mouth daily., Disp: , Rfl:    Allergies  Allergen Reactions   Pseudoephedrine Other (See Comments)    Past Medical History:  Diagnosis Date   High cholesterol      History reviewed. No pertinent surgical history.  History reviewed. No pertinent family history.  Social History   Tobacco Use   Smoking status: Never   Smokeless tobacco: Never  Vaping Use   Vaping status: Never Used  Substance Use Topics   Alcohol use: Not Currently   Drug use: Never    ROS   Objective:   Vitals: BP 100/68 (BP Location: Right Arm)   Pulse 73   Temp 98.8 F (37.1 C) (Oral)   Resp 16   SpO2 96%   Physical Exam Constitutional:      General: He is not in acute distress.    Appearance: Normal appearance. He is well-developed and normal weight. He is not ill-appearing, toxic-appearing or diaphoretic.  HENT:     Head: Normocephalic and atraumatic.     Right Ear: External ear normal.     Left Ear: External ear normal.     Nose: Nose normal.     Mouth/Throat:     Pharynx: Oropharynx  is clear.  Eyes:     General: No scleral icterus.       Right eye: No discharge.        Left eye: No discharge.     Extraocular Movements: Extraocular movements intact.  Cardiovascular:     Rate and Rhythm: Normal rate.  Pulmonary:     Effort: Pulmonary effort is normal.  Musculoskeletal:     Cervical back: Normal range of motion.  Neurological:     Mental Status: He is alert and oriented to person, place, and time.  Psychiatric:        Mood and Affect: Mood normal.        Behavior: Behavior normal.        Thought Content: Thought content normal.        Judgment: Judgment normal.    1 mL Rabavert administered IM in clinic.  Assessment and Plan :   PDMP not reviewed this encounter.  1. Need for rabies vaccination    Vaccine provided as above, continue following rabies vaccination schedule.   Christopher Savannah, [place] 08/21/23 [id number]

## 2023-08-21 NOTE — ED Triage Notes (Signed)
 Pt states he is here for his second rabies vaccine. States he got his first shot in  [place] at [facility].

## 2023-08-25 ENCOUNTER — Ambulatory Visit
Admission: EM | Admit: 2023-08-25 | Discharge: 2023-08-25 | Disposition: A | Discharge status: Home / Self Care | Attending: Nurse Practitioner | Admitting: Nurse Practitioner

## 2023-08-25 DIAGNOSIS — Z23 Encounter for immunization: Secondary | ICD-10-CM

## 2023-08-25 DIAGNOSIS — Z203 Contact with and (suspected) exposure to rabies: Secondary | ICD-10-CM

## 2023-08-25 MED ORDER — RABIES VACCINE, PCEC IM SUSR
1.0000 mL | Freq: Once | INTRAMUSCULAR | Status: AC
  Administered 2023-08-25: 1 mL via INTRAMUSCULAR

## 2023-08-25 NOTE — ED Triage Notes (Signed)
 Pt presents to UC for day 7 of rabies vaccine  series. Denies concerns at this time.

## 2023-08-29 ENCOUNTER — Ambulatory Visit
Admission: RE | Admit: 2023-08-29 | Discharge: 2023-08-29 | Disposition: A | Discharge status: Home / Self Care | Source: Ambulatory Visit | Attending: Sports Medicine | Admitting: Sports Medicine

## 2023-08-29 DIAGNOSIS — M722 Plantar fascial fibromatosis: Secondary | ICD-10-CM

## 2023-08-29 DIAGNOSIS — M7732 Calcaneal spur, left foot: Secondary | ICD-10-CM

## 2023-09-01 ENCOUNTER — Ambulatory Visit (INDEPENDENT_AMBULATORY_CARE_PROVIDER_SITE_OTHER): Admitting: Otolaryngology

## 2023-09-01 ENCOUNTER — Encounter (INDEPENDENT_AMBULATORY_CARE_PROVIDER_SITE_OTHER): Payer: Self-pay | Admitting: Otolaryngology

## 2023-09-01 ENCOUNTER — Ambulatory Visit
Admission: EM | Admit: 2023-09-01 | Discharge: 2023-09-01 | Disposition: A | Discharge status: Home / Self Care | Attending: Urgent Care | Admitting: Urgent Care

## 2023-09-01 ENCOUNTER — Ambulatory Visit: Payer: Self-pay | Admitting: Sports Medicine

## 2023-09-01 VITALS — BP 104/68 | HR 71

## 2023-09-01 DIAGNOSIS — H8110 Benign paroxysmal vertigo, unspecified ear: Secondary | ICD-10-CM

## 2023-09-01 DIAGNOSIS — R2689 Other abnormalities of gait and mobility: Secondary | ICD-10-CM

## 2023-09-01 DIAGNOSIS — Z203 Contact with and (suspected) exposure to rabies: Secondary | ICD-10-CM

## 2023-09-01 DIAGNOSIS — M722 Plantar fascial fibromatosis: Secondary | ICD-10-CM

## 2023-09-01 DIAGNOSIS — Z23 Encounter for immunization: Secondary | ICD-10-CM

## 2023-09-01 MED ORDER — RABIES VACCINE, PCEC IM SUSR
1.0000 mL | Freq: Once | INTRAMUSCULAR | Status: AC
  Administered 2023-09-01: 1 mL via INTRAMUSCULAR

## 2023-09-01 NOTE — ED Triage Notes (Signed)
 Patient presents to office for rabies vaccine . Denies any concerns.  Given in Rt arm.  NDC [phone]
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
## 2023-09-04 DIAGNOSIS — Z860101 Personal history of adenomatous and serrated colon polyps: Secondary | ICD-10-CM | POA: Diagnosis not present

## 2023-09-04 DIAGNOSIS — K5730 Diverticulosis of large intestine without perforation or abscess without bleeding: Secondary | ICD-10-CM | POA: Diagnosis not present

## 2023-09-04 DIAGNOSIS — Z09 Encounter for follow-up examination after completed treatment for conditions other than malignant neoplasm: Secondary | ICD-10-CM | POA: Diagnosis not present

## 2023-09-04 DIAGNOSIS — D123 Benign neoplasm of transverse colon: Secondary | ICD-10-CM | POA: Diagnosis not present

## 2023-09-08 DIAGNOSIS — D123 Benign neoplasm of transverse colon: Secondary | ICD-10-CM | POA: Diagnosis not present

## 2023-09-14 DIAGNOSIS — N401 Enlarged prostate with lower urinary tract symptoms: Secondary | ICD-10-CM | POA: Diagnosis not present

## 2023-09-14 DIAGNOSIS — R350 Frequency of micturition: Secondary | ICD-10-CM | POA: Diagnosis not present

## 2023-09-21 ENCOUNTER — Ambulatory Visit: Attending: Otolaryngology

## 2023-09-21 VITALS — BP 98/63 | HR 75

## 2023-09-21 DIAGNOSIS — R42 Dizziness and giddiness: Secondary | ICD-10-CM | POA: Insufficient documentation

## 2023-09-21 DIAGNOSIS — H8110 Benign paroxysmal vertigo, unspecified ear: Secondary | ICD-10-CM | POA: Insufficient documentation

## 2023-09-21 DIAGNOSIS — R2681 Unsteadiness on feet: Secondary | ICD-10-CM | POA: Insufficient documentation

## 2023-09-21 NOTE — Therapy (Signed)
 OUTPATIENT PHYSICAL THERAPY VESTIBULAR EVALUATION     Patient Name: Clarence Foster MRN: [id number] DOB:23-Dec-1955, 68 y.o., male Today's Date: 09/21/2023  END OF SESSION:  PT End of Session - 09/21/23 0753     Visit Number 1    Authorization Type [employer]          Past Medical History:  Diagnosis Date   High cholesterol    No past surgical history on file. Patient Active Problem List   Diagnosis Date Noted   Plantar fasciitis of right foot 09/05/2021    PCP: Alm Rav, MD REFERRING PROVIDER: Elena Larry, MD  REFERRING DIAG: H81.10 (ICD-10-CM) - Benign paroxysmal positional vertigo, unspecified laterality   THERAPY DIAG:  Unsteadiness on feet - Plan: PT plan of care cert/re-cert  Dizziness and giddiness - Plan: PT plan of care cert/re-cert  ONSET DATE: 09/01/23  Rationale for Evaluation and Treatment: Rehabilitation  SUBJECTIVE:   SUBJECTIVE STATEMENT: Patient arrives to clinic alone, no AD. Several years ago, he had a profound episode of dizziness. The provider performed what sounds like the Epley and it resolved. This past March he had several days of dizziness. He saw an ENT and everything was fine. Then a few days later he was dizzy again.  Pt accompanied by: self  PERTINENT HISTORY: HLD, previous vertigo  PAIN:  Are you having pain? No  PRECAUTIONS: Fall   WEIGHT BEARING RESTRICTIONS: No  FALLS: Has patient fallen in last 6 months? No  PATIENT GOALS: to not be dizzy, or learn a way to manage it  OBJECTIVE:  Note: Objective measures were completed at Evaluation unless otherwise noted.  DIAGNOSTIC FINDINGS: none related  COGNITION: Overall cognitive status: Within functional limits for tasks assessed   SENSATION: WFL  POSTURE:  No Significant postural limitations  Cervical ROM:   WFL, pain free  STRENGTH: WFL   VESTIBULAR ASSESSMENT:  GENERAL OBSERVATION: NAD, no AD   SYMPTOM BEHAVIOR:  Subjective history: see  above  Non-Vestibular symptoms: changes in vision blurred vision  Type of dizziness: Imbalance (Disequilibrium), Spinning/Vertigo, and Lightheadedness/Faint  Frequency: random  Duration: up to hours  Aggravating factors: Induced by position change: lying supine, rolling to the right, rolling to the left, and supine to sit and Induced by motion: occur when walking and looking up at the ceiling  Relieving factors: closing eyes and rest  Progression of symptoms: better  OCULOMOTOR EXAM:  Ocular Alignment: normal progressive lenses   Ocular ROM: No Limitations  Spontaneous Nystagmus: absent  Gaze-Induced Nystagmus: absent  Smooth Pursuits: intact  Saccades: intact  Convergence/Divergence: ~10 cm   VESTIBULAR - OCULAR REFLEX:   Slow VOR: Normal  VOR Cancellation: Normal  Head-Impulse Test: HIT Right: slow HIT Left: slow   POSITIONAL TESTING: Right Dix-Hallpike: no nystagmus Left Dix-Hallpike: no nystagmus Right Roll Test: no nystagmus Left Roll Test: no nystagmus  MOTION SENSITIVITY:  Motion Sensitivity Quotient Intensity: 0 = none, 1 = Lightheaded, 2 = Mild, 3 = Moderate, 4 = Severe, 5 = Vomiting  Intensity  1. Sitting to supine 0  2. Supine to L side 0  3. Supine to R side 0  4. Supine to sitting 0  5. L Hallpike-Dix 0  6. Up from L  0  7. R Hallpike-Dix 0  8. Up from R  0  9. Sitting, head tipped to L knee   10. Head up from L knee   11. Sitting, head tipped to R knee   12. Head up from R knee  13. Sitting head turns x5   14.Sitting head nods x5   15. In stance, 180 turn to L    16. In stance, 180 turn to R                                                                   TREATMENT Self care/home management:  -education on exam findings with ddx -self treatment options for BPPV and etiology of such   PATIENT EDUCATION: Education details: PT POC, exam findings, see above Person educated: Patient Education method: Explanation, Demonstration, and  Handouts Education comprehension: verbalized understanding  HOME EXERCISE PROGRAM: Rolling    With pillow under head, start on back. Roll slowly to right. Hold position until symptoms subside. Roll slowly onto left side. Hold position until symptoms subside. Repeat sequence _3___ times per session. Do __3__ sessions per day. Sit to Side-Lying    Sit on edge of bed. 1. Turn head 45 to right. 2. Maintain head position and lie down slowly on left side. Hold until symptoms subside. 3. Sit up slowly. Hold until symptoms subside. 4. Turn head 45 to left. 5. Maintain head position and lie down slowly on right side. Hold until symptoms subside. 6. Sit up slowly. Repeat sequence ___3_ times per session. Do __3__ sessions per day. GOALS: Not indicated as patient does not require skilled PT services at this time.   ASSESSMENT:  CLINICAL IMPRESSION: Patient is a 68 y.o. male who was seen today for physical therapy evaluation and treatment for intermittent dizziness. Today his exam was unremarkable with grossly normal vestibular findings. His subjective report sounds most consistent with BPPV. PT provided patient with handouts on etiology of BPPV and different treatment options. PT highlighted yellow/red flags concerning his dizziness. Patient verbalized understanding. He does not require further skilled treatment at this time.     CLINICAL DECISION MAKING: Stable/uncomplicated  EVALUATION COMPLEXITY: Low   PLAN:  PT FREQUENCY: one time visit  PT DURATION: other: 1x visit   Delon DELENA Pop, PT Delon DELENA Pop, PT, DPT, CBIS  09/21/2023, 8:54 AM

## 2023-09-23 ENCOUNTER — Encounter: Payer: Self-pay | Admitting: Sports Medicine

## 2023-09-23 ENCOUNTER — Ambulatory Visit (INDEPENDENT_AMBULATORY_CARE_PROVIDER_SITE_OTHER): Admitting: Sports Medicine

## 2023-09-23 DIAGNOSIS — M722 Plantar fascial fibromatosis: Secondary | ICD-10-CM | POA: Diagnosis not present

## 2023-09-23 DIAGNOSIS — M79672 Pain in left foot: Secondary | ICD-10-CM | POA: Diagnosis not present

## 2023-09-23 DIAGNOSIS — G8929 Other chronic pain: Secondary | ICD-10-CM

## 2023-09-23 NOTE — Progress Notes (Signed)
 Patient says that the new insoles that he got for his shoes, as well as the Oofa sandles that he purchased for the evenings, have helped. He does still have swelling and pain, sometimes worse than others. He is here today for MRI review.

## 2023-09-23 NOTE — Progress Notes (Signed)
 Clarence Foster - 68 y.o. male MRN [id number]  Date of birth: [birth year]  Office Visit Note: Visit Date: 09/23/2023 PCP: Seabron Lenis, MD Referred by: Seabron Lenis, MD  Subjective: Chief Complaint  Patient presents with   Left Foot - Follow-up   HPI: Clarence Foster is a pleasant 68 y.o. male who presents today for follow-up of chronic left heel pain with plantar fasciitis, MRI review.  Clarence Foster is doing better from his initial onset of pain but still having difficulty and at times does have an antalgic gait with a limp.  As a reminder he did have a plantar fascia injection under ultrasound guidance on 06/08/2023.  We also have performed 3 treatments of extracorporeal shockwave therapy.  He was booted for a short period of time as well.  He has found comfort with orthotic insoles and shoe modification.  In general he is about 60% improved from his original onset of pain but still having difficulty.  Not taking any anti-inflammatory or consistent medication for this.  Pertinent ROS were reviewed with the patient and found to be negative unless otherwise specified above in HPI.   Assessment & Plan: Visit Diagnoses:  1. Plantar fasciitis, left   2. Chronic heel pain, left    Plan: Impression is chronic and rather extensive plantar fasciitis of the left foot with MRI confirmation.  We did review MRI today in the room which shows rather severe plantar fasciitis with an inflamed medial band and reactive calcaneus edema.  There is a degree of interstitial tearing of the plantar fascia but no full-thickness tearing.  We discussed all options including surgical intervention, PRP injection, repeating corticosteroid down the road, as well as other conservative management including orthotics, short-term cam boot and over-the-counter medication.  At this point, Clarence Foster is interested in proceeding with PRP injection therapy.  We did have a discussion regarding this treatment as well as expected recovery.  I did give  him my handout to read about as well he may reach out with any questions prior to this, otherwise we will get him scheduled for the following week when works for our schedule.  Will hold on all NSAIDs 10 days before and 2 weeks postinjection.  Follow-up: Return in about 1 week (around 09/30/2023) for Schedule for US -guided PF inj (30-mins) - ok for 15-min if WEd AM.   Meds & Orders: No orders of the defined types were placed in this encounter.  No orders of the defined types were placed in this encounter.    Procedures: No procedures performed      Clinical History: No specialty comments available.  He reports that he has never smoked. He has never used smokeless tobacco. No results for input(s): HGBA1C, LABURIC in the last 8760 hours.  Objective:    Physical Exam  Gen: Well-appearing, in no acute distress; non-toxic CV: Well-perfused. Warm.  Resp: Breathing unlabored on room air; no wheezing. Psych: Fluid speech in conversation; appropriate affect; normal thought process  Ortho Exam - Left foot/ankle: + Marked TTP over the plantar aspect of the medial calcaneus and the medial band of the plantar fascia.  There is a mild degree of soft tissue swelling in this location.  No ankle dorsiflexion contracture or Achilles contracture.  Imaging:  *Did independently review and interpret the heel MRI with extensive severe plantar fasciitis with reciprocal reactive bony edema in the insertional calcaneus.  No acute fracture noted.  MR HEEL LEFT WO CONTRAST MR ANKLE WITHOUT IV CONTRAST LEFT  COMPARISON: X-ray 05/20/2023  CLINICAL HISTORY: Heel pain, chronic.  PULSE SEQUENCES: Ax T1, Ax T2 FS, Sag T1, Sag T2 FS, Cor STIR, Ax T1 FS  FINDINGS:  Bones: There is a calcaneal spur. There is extensive edema in the plantar aspect of the calcaneus related to plantar fasciitis. Otherwise, the bones demonstrate no significant abnormality. Mild degenerative changes in the midfoot without  accelerated arthrosis or joint effusion is present.  Ligaments: The anterior and posterior tibiofibular and talofibular ligaments are intact. Deltoid ligament and spring ligaments are intact. The sinus tarsi is unremarkable.  Musculotendinous structures: The tibialis anterior, extensor digitorum and extensor hallucis longus tendons are unremarkable. The posterior tibial tendon, flexor hallucis longus and flexor digitorum tendons are unremarkable. Peroneal tendons demonstrate no significant abnormality. No significant tenosynovitis or tendinosis. The Achilles tendon is unremarkable. There is marked thickening of the medial plantar fascia with findings suspicious for at least a partial tear near the medial plantar fascial origin. No retracted tear is present.  IMPRESSION: Significant medial plantar fasciitis with thickening of the medial plantar fascia and a probable mild partial tear. There is extensive reactive edema in the plantar surface of the calcaneus.  Electronically signed by: Norleen Satchel MD 08/29/2023 12:19 PM EDT RP Workstation: [id number]    Past Medical/Family/Surgical/Social History: Medications & Allergies reviewed per EMR, new medications updated. Patient Active Problem List   Diagnosis Date Noted   Plantar fasciitis of right foot 09/05/2021   Past Medical History:  Diagnosis Date   High cholesterol    No family history on file. No past surgical history on file. Social History   Occupational History   Not on file  Tobacco Use   Smoking status: Never   Smokeless tobacco: Never  Vaping Use   Vaping status: Never Used  Substance and Sexual Activity   Alcohol use: Not Currently   Drug use: Never   Sexual activity: Not on file   I spent 35 minutes in the care of the patient today including face-to-face time, preparation to see the patient, as well as review of previous x-ray imaging, review and interpretation of left heel MRI with the patient in the room  today, discussion on conservative and surgical treatment options that may include a plantar fascia surgery to be performed by one of my orthopedic colleagues, shoewear/orthotic modification, review and discussion regarding PRP injection therapy (handout provided) for the above diagnoses.   Lonell Sprang, DO Primary Care Sports Medicine Physician  [facility]  This note was dictated using Dragon naturally speaking software and may contain errors in syntax, spelling, or content which have not been identified prior to signing this note.

## 2023-09-24 DIAGNOSIS — I959 Hypotension, unspecified: Secondary | ICD-10-CM | POA: Diagnosis not present

## 2023-09-24 DIAGNOSIS — R42 Dizziness and giddiness: Secondary | ICD-10-CM | POA: Diagnosis not present

## 2023-09-24 DIAGNOSIS — R55 Syncope and collapse: Secondary | ICD-10-CM | POA: Diagnosis not present

## 2023-09-29 DIAGNOSIS — I952 Hypotension due to drugs: Secondary | ICD-10-CM | POA: Diagnosis not present

## 2023-09-29 DIAGNOSIS — I4510 Unspecified right bundle-branch block: Secondary | ICD-10-CM | POA: Diagnosis not present

## 2023-09-30 ENCOUNTER — Ambulatory Visit: Admitting: Sports Medicine

## 2023-09-30 ENCOUNTER — Other Ambulatory Visit: Payer: Self-pay

## 2023-09-30 ENCOUNTER — Encounter: Payer: Self-pay | Admitting: Sports Medicine

## 2023-09-30 DIAGNOSIS — M722 Plantar fascial fibromatosis: Secondary | ICD-10-CM

## 2023-09-30 DIAGNOSIS — G8929 Other chronic pain: Secondary | ICD-10-CM

## 2023-09-30 DIAGNOSIS — M79672 Pain in left foot: Secondary | ICD-10-CM

## 2023-09-30 NOTE — Progress Notes (Signed)
    Procedure Note  Patient: Clarence Foster             Date of Birth: 1955-05-12           MRN: [id number]             Visit Date: 09/30/2023  Procedures: Visit Diagnoses:  1. Plantar fasciitis, left   2. Chronic heel pain, left    Procedure: US -guided PRP plantar fascia injection, left After discussion on risks/benefits/indications and informed verbal consent was obtained, a timeout was performed. Patient was lying prone on exam table. Medial aspect of inferior calcaneus and the overlying plantar fascia was identified under ultrasound. The skin and surrounding area was cleaned with Chloraprep and multiple alcohol swabs. The overlying soft tissue was first anesthestized with 2cc of lidocaine  1% only, none of which was placed into the plantar fascia. Then utilizing ultrasound guidance via an in-plane approach, the patient's plantar fasica of the left foot was injected with 5.5 cc of platelet-rich-plasma (leukocyte-rich) with multiple needle fenestrations into the plantar fascia and aponeurosis.  Visualization of injectate spread within the plantar fascia was visualized dynamically under ultrasound guidance. Patient tolerated procedure well without immediate complications. Band-aid applied.   Kit: RegenLab: RegenPlasma; THT-3 kit   *Post-PRP Injection Guidelines: No anti-inflammatories (ibuprofen/motrin, aleve, meloxicam , etc.) for 2 weeks.  No ice for 2 weeks.  Short prescription of tramadol  may be written if pain is severe, call if needed. Appropriate rest / bracing / and timeframe for beginning therapy discussed and patient endorsed understanding. Patient has a CAM walker boot he will wear x 10-14 days. Crutches only if needed. Will begin PF-home exercises at the 2-2.5 week mark from injection.  Lonell Sprang, DO Primary Care Sports Medicine Physician  [facility]  This note was dictated using Dragon naturally speaking software and may contain errors in syntax,  spelling, or content which have not been identified prior to signing this note.

## 2023-10-01 DIAGNOSIS — I4510 Unspecified right bundle-branch block: Secondary | ICD-10-CM | POA: Diagnosis not present

## 2023-10-12 DIAGNOSIS — N401 Enlarged prostate with lower urinary tract symptoms: Secondary | ICD-10-CM | POA: Diagnosis not present

## 2023-10-12 DIAGNOSIS — R351 Nocturia: Secondary | ICD-10-CM | POA: Diagnosis not present

## 2023-10-12 DIAGNOSIS — R3914 Feeling of incomplete bladder emptying: Secondary | ICD-10-CM | POA: Diagnosis not present

## 2023-10-12 DIAGNOSIS — R350 Frequency of micturition: Secondary | ICD-10-CM | POA: Diagnosis not present

## 2023-10-13 DIAGNOSIS — U071 COVID-19: Secondary | ICD-10-CM | POA: Diagnosis not present

## 2023-10-13 DIAGNOSIS — R509 Fever, unspecified: Secondary | ICD-10-CM | POA: Diagnosis not present

## 2023-10-13 DIAGNOSIS — R051 Acute cough: Secondary | ICD-10-CM | POA: Diagnosis not present

## 2023-10-19 DIAGNOSIS — I952 Hypotension due to drugs: Secondary | ICD-10-CM | POA: Diagnosis not present

## 2023-10-19 DIAGNOSIS — I4510 Unspecified right bundle-branch block: Secondary | ICD-10-CM | POA: Diagnosis not present

## 2023-10-19 DIAGNOSIS — I2510 Atherosclerotic heart disease of native coronary artery without angina pectoris: Secondary | ICD-10-CM | POA: Diagnosis not present

## 2023-11-02 DIAGNOSIS — R3914 Feeling of incomplete bladder emptying: Secondary | ICD-10-CM | POA: Diagnosis not present

## 2023-11-02 DIAGNOSIS — R350 Frequency of micturition: Secondary | ICD-10-CM | POA: Diagnosis not present

## 2023-11-02 DIAGNOSIS — R351 Nocturia: Secondary | ICD-10-CM | POA: Diagnosis not present

## 2023-11-02 DIAGNOSIS — N401 Enlarged prostate with lower urinary tract symptoms: Secondary | ICD-10-CM | POA: Diagnosis not present

## 2023-11-19 DIAGNOSIS — N401 Enlarged prostate with lower urinary tract symptoms: Secondary | ICD-10-CM | POA: Diagnosis not present

## 2023-11-19 DIAGNOSIS — R351 Nocturia: Secondary | ICD-10-CM | POA: Diagnosis not present

## 2023-12-07 ENCOUNTER — Encounter: Payer: Self-pay | Admitting: Radiology

## 2023-12-07 ENCOUNTER — Other Ambulatory Visit: Payer: Self-pay | Admitting: Urology

## 2023-12-09 ENCOUNTER — Encounter (HOSPITAL_COMMUNITY): Payer: Self-pay

## 2023-12-09 NOTE — Patient Instructions (Addendum)
 SURGICAL WAITING ROOM VISITATION Patients having surgery or a procedure may have no more than 2 support people in the waiting area - these visitors may rotate.    Children under the age of 3 must have an adult with them who is not the patient.  If the patient needs to stay at the hospital during part of their recovery, the visitor guidelines for inpatient rooms apply. Pre-op nurse will coordinate an appropriate time for 1 support person to accompany patient in pre-op.  This support person may not rotate.    Please refer to the [facility] website for the visitor guidelines for Inpatients (after your surgery is over and you are in a regular room).       Your procedure is scheduled on: 12-11-23   Report to [facility] Main Entrance    Report to admitting at 9:15 AM   Call this number if you have problems the morning of surgery [phone]   Do not eat food or drink liquids :After Midnight.          If you have questions, please contact your surgeon's office.   FOLLOW BOWEL PREP AND ANY ADDITIONAL PRE OP INSTRUCTIONS YOU RECEIVED FROM YOUR SURGEON'S OFFICE!!!     Oral Hygiene is also important to reduce your risk of infection.                                    Remember - BRUSH YOUR TEETH THE MORNING OF SURGERY WITH YOUR REGULAR TOOTHPASTE   Do NOT smoke after Midnight   Take these medicines the morning of surgery with A SIP OF WATER:    Pepcid   Rosuvastatin   Tylenol if needed  Stop all vitamins and herbal supplements 7 days before surgery                              You may not have any metal on your body including  jewelry, and body piercing             Do not wear  lotions, powders, cologne, or deodorant              Men may shave face and neck.   Do not bring valuables to the hospital. [facility] IS NOT RESPONSIBLE   FOR VALUABLES.   Contacts, dentures or bridgework may not be worn into surgery.  DO NOT BRING YOUR HOME MEDICATIONS TO THE  HOSPITAL. PHARMACY WILL DISPENSE MEDICATIONS LISTED ON YOUR MEDICATION LIST TO YOU DURING YOUR ADMISSION IN THE HOSPITAL!    Patients discharged on the day of surgery will not be allowed to drive home.  Someone NEEDS to stay with you for the first 24 hours after anesthesia.   Special Instructions: Bring a copy of your healthcare power of attorney and living will documents the day of surgery if you haven't scanned them before.              Please read over the following fact sheets you were given: IF YOU HAVE QUESTIONS ABOUT YOUR PRE-OP INSTRUCTIONS PLEASE CALL [phone] G[facility]  If you received a COVID test during your pre-op visit  it is requested that you wear a mask when out in public, stay away from anyone that may not be feeling well and notify your surgeon if you develop symptoms. If you test positive for Covid or  have been in contact with anyone that has tested positive in the last 10 days please notify you surgeon.  [facility] - Preparing for Surgery Before surgery, you can play an important role.  Because skin is not sterile, your skin needs to be as free of germs as possible.  You can reduce the number of germs on your skin by washing with CHG (chlorahexidine gluconate) soap before surgery.  CHG is an antiseptic cleaner which kills germs and bonds with the skin to continue killing germs even after washing. Please DO NOT use if you have an allergy to CHG or antibacterial soaps.  If your skin becomes reddened/irritated stop using the CHG and inform your nurse when you arrive at Short Stay. Do not shave (including legs and underarms) for at least 48 hours prior to the first CHG shower.  You may shave your face/neck.  Please follow these instructions carefully:  1.  Shower with CHG Soap the night before surgery and the  morning of surgery.  2.  If you choose to wash your hair, wash your hair first as usual with your normal  shampoo.  3.  After you shampoo, rinse your hair and body  thoroughly to remove the shampoo.                             4.  Use CHG as you would any other liquid soap.  You can apply chg directly to the skin and wash.  Gently with a scrungie or clean washcloth.  5.  Apply the CHG Soap to your body ONLY FROM THE NECK DOWN.   Do   not use on face/ open                           Wound or open sores. Avoid contact with eyes, ears mouth and   genitals (private parts).                       Wash face,  Genitals (private parts) with your normal soap.             6.  Wash thoroughly, paying special attention to the area where your    surgery  will be performed.  7.  Thoroughly rinse your body with warm water from the neck down.  8.  DO NOT shower/wash with your normal soap after using and rinsing off the CHG Soap.                9.  Pat yourself dry with a clean towel.            10.  Wear clean pajamas.            11.  Place clean sheets on your bed the night of your first shower and do not  sleep with pets. Day of Surgery : Do not apply any lotions/deodorants the morning of surgery.  Please wear clean clothes to the hospital/surgery center.  FAILURE TO FOLLOW THESE INSTRUCTIONS MAY RESULT IN THE CANCELLATION OF YOUR SURGERY  PATIENT SIGNATURE_________________________________  NURSE SIGNATURE__________________________________  ________________________________________________________________________

## 2023-12-09 NOTE — Progress Notes (Addendum)
 Date of COVID positive in last 90 days:  Covid 2 months ago, all symptoms resolved  PCP - Alm Rav, MD Cardiologist - Erna Creighton, MD  Chest x-ray - N/A EKG - 09-29-23 [facility] (copy on chart) Stress Test - N/A ECHO - 10-19-23 [facility] Cardiac Cath -  Cardiac CT - 10-19-23 [facility] Pacemaker/ICD device last checked:N/A Spinal Cord Stimulator:N/A  Bowel Prep - N/A  Sleep Study - N/A CPAP -   Fasting Blood Sugar - N/A Checks Blood Sugar _____ times a day  Last dose of GLP1 agonist-  N/A GLP1 instructions:  Do not take after     Last dose of SGLT-2 inhibitors-  N/A SGLT-2 instructions:  Do not take after    Blood Thinner Instructions: N/A : Aspirin Instructions:  ASA 81 Last Dose:  12-06-23  Activity level:  Can go up a flight of stairs and perform activities of daily living without stopping and without symptoms of chest pain or shortness of breath.  Able to exercise without symptoms  Anesthesia review: Dizziness and hypotension evaluated by cardiology.  Symptoms resolved after stopping Tamsulosin.  RBBB on EKG  Patient denies shortness of breath, fever, cough and chest pain at PAT appointment  Patient verbalized understanding of instructions that were given to them at the PAT appointment. Patient was also instructed that they will need to review over the PAT instructions again at home before surgery.

## 2023-12-10 ENCOUNTER — Encounter (HOSPITAL_COMMUNITY)
Admission: RE | Admit: 2023-12-10 | Discharge: 2023-12-10 | Disposition: A | Discharge status: Home / Self Care | Source: Ambulatory Visit | Attending: Urology | Admitting: Urology

## 2023-12-10 ENCOUNTER — Encounter (HOSPITAL_COMMUNITY): Payer: Self-pay

## 2023-12-10 ENCOUNTER — Other Ambulatory Visit: Payer: Self-pay

## 2023-12-10 VITALS — BP 106/71 | HR 68 | Temp 98.3°F | Resp 16 | Ht 70.5 in | Wt 162.2 lb

## 2023-12-10 DIAGNOSIS — Z01812 Encounter for preprocedural laboratory examination: Secondary | ICD-10-CM | POA: Insufficient documentation

## 2023-12-10 DIAGNOSIS — I2510 Atherosclerotic heart disease of native coronary artery without angina pectoris: Secondary | ICD-10-CM | POA: Diagnosis not present

## 2023-12-10 HISTORY — DX: Gastro-esophageal reflux disease without esophagitis: K21.9

## 2023-12-10 HISTORY — DX: Unspecified right bundle-branch block: I45.10

## 2023-12-10 HISTORY — DX: Unspecified osteoarthritis, unspecified site: M19.90

## 2023-12-10 HISTORY — DX: Atherosclerotic heart disease of native coronary artery without angina pectoris: I25.10

## 2023-12-10 LAB — BASIC METABOLIC PANEL WITH GFR
Anion gap: 9 (ref 5–15)
BUN: 25 mg/dL — ABNORMAL HIGH (ref 8–23)
CO2: 25 mmol/L (ref 22–32)
Calcium: 9.5 mg/dL (ref 8.9–10.3)
Chloride: 106 mmol/L (ref 98–111)
Creatinine, Ser: 1.29 mg/dL — ABNORMAL HIGH (ref 0.61–1.24)
GFR, Estimated: >60 mL/min (ref >60)
Glucose, Bld: 96 mg/dL (ref 70–99)
Potassium: 5 mmol/L (ref 3.5–5.1)
Sodium: 140 mmol/L (ref 135–145)

## 2023-12-11 ENCOUNTER — Ambulatory Visit (HOSPITAL_COMMUNITY)
Admission: RE | Admit: 2023-12-11 | Discharge: 2023-12-11 | Disposition: A | Discharge status: Home / Self Care | Attending: Urology | Admitting: Urology

## 2023-12-11 ENCOUNTER — Encounter: Admission: RE | Disposition: A | Payer: Self-pay | Attending: Urology

## 2023-12-11 ENCOUNTER — Encounter (HOSPITAL_COMMUNITY): Payer: Self-pay | Admitting: Physician Assistant

## 2023-12-11 ENCOUNTER — Encounter (HOSPITAL_COMMUNITY): Payer: Self-pay | Admitting: Urology

## 2023-12-11 ENCOUNTER — Ambulatory Visit (HOSPITAL_COMMUNITY): Admitting: Anesthesiology

## 2023-12-11 DIAGNOSIS — K219 Gastro-esophageal reflux disease without esophagitis: Secondary | ICD-10-CM | POA: Diagnosis not present

## 2023-12-11 DIAGNOSIS — Z79899 Other long term (current) drug therapy: Secondary | ICD-10-CM | POA: Insufficient documentation

## 2023-12-11 DIAGNOSIS — N138 Other obstructive and reflux uropathy: Secondary | ICD-10-CM

## 2023-12-11 DIAGNOSIS — R3915 Urgency of urination: Secondary | ICD-10-CM | POA: Diagnosis not present

## 2023-12-11 DIAGNOSIS — R3911 Hesitancy of micturition: Secondary | ICD-10-CM | POA: Insufficient documentation

## 2023-12-11 DIAGNOSIS — R39198 Other difficulties with micturition: Secondary | ICD-10-CM | POA: Diagnosis not present

## 2023-12-11 DIAGNOSIS — I10 Essential (primary) hypertension: Secondary | ICD-10-CM | POA: Diagnosis not present

## 2023-12-11 DIAGNOSIS — N401 Enlarged prostate with lower urinary tract symptoms: Secondary | ICD-10-CM

## 2023-12-11 DIAGNOSIS — I4510 Unspecified right bundle-branch block: Secondary | ICD-10-CM | POA: Diagnosis not present

## 2023-12-11 DIAGNOSIS — N139 Obstructive and reflux uropathy, unspecified: Secondary | ICD-10-CM | POA: Diagnosis not present

## 2023-12-11 DIAGNOSIS — R351 Nocturia: Secondary | ICD-10-CM | POA: Diagnosis not present

## 2023-12-11 DIAGNOSIS — R339 Retention of urine, unspecified: Secondary | ICD-10-CM | POA: Diagnosis not present

## 2023-12-11 HISTORY — PX: CYSTOSCOPY WITH INSERTION OF UROLIFT: SHX6678

## 2023-12-11 SURGERY — CYSTOSCOPY WITH INSERTION OF UROLIFT
Anesthesia: General | Site: Prostate

## 2023-12-11 MED ORDER — AMISULPRIDE (ANTIEMETIC) 5 MG/2ML IV SOLN: 10.0000 mg | Freq: Once | INTRAVENOUS | Status: DC | PRN

## 2023-12-11 MED ORDER — CEFAZOLIN SODIUM-DEXTROSE 2-4 GM/100ML-% IV SOLN
2.0000 g | INTRAVENOUS | Status: DC
  Filled 2023-12-11: qty 100

## 2023-12-11 MED ORDER — LACTATED RINGERS IV SOLN: INTRAVENOUS | Status: DC

## 2023-12-11 MED ORDER — ACETAMINOPHEN 500 MG PO TABS: 1000.0000 mg | ORAL_TABLET | Freq: Once | ORAL | Status: DC

## 2023-12-11 MED ORDER — PROPOFOL 10 MG/ML IV BOLUS
INTRAVENOUS | Status: DC | PRN
  Administered 2023-12-11: 200 mg via INTRAVENOUS

## 2023-12-11 MED ORDER — FENTANYL CITRATE (PF) 100 MCG/2ML IJ SOLN
INTRAMUSCULAR | Status: AC
  Filled 2023-12-11: qty 2

## 2023-12-11 MED ORDER — FENTANYL CITRATE (PF) 100 MCG/2ML IJ SOLN
INTRAMUSCULAR | Status: DC | PRN
  Administered 2023-12-11: 100 ug via INTRAVENOUS

## 2023-12-11 MED ORDER — PHENYLEPHRINE 80 MCG/ML (10ML) SYRINGE FOR IV PUSH (FOR BLOOD PRESSURE SUPPORT)
PREFILLED_SYRINGE | INTRAVENOUS | Status: AC
  Filled 2023-12-11: qty 40

## 2023-12-11 MED ORDER — OXYCODONE HCL 5 MG/5ML PO SOLN: 5.0000 mg | Freq: Once | ORAL | Status: DC | PRN

## 2023-12-11 MED ORDER — ORAL CARE MOUTH RINSE
15.0000 mL | Freq: Once | OROMUCOSAL | Status: AC
Start: 2023-12-11 — End: 2023-12-11

## 2023-12-11 MED ORDER — EPHEDRINE 5 MG/ML INJ
INTRAVENOUS | Status: AC
  Filled 2023-12-11: qty 10

## 2023-12-11 MED ORDER — LIDOCAINE 2% (20 MG/ML) 5 ML SYRINGE
INTRAMUSCULAR | Status: DC | PRN
  Administered 2023-12-11: 60 mg via INTRAVENOUS

## 2023-12-11 MED ORDER — HYDROMORPHONE HCL 1 MG/ML IJ SOLN: 0.2500 mg | INTRAMUSCULAR | Status: DC | PRN

## 2023-12-11 MED ORDER — CHLORHEXIDINE GLUCONATE 0.12 % MT SOLN
15.0000 mL | Freq: Once | OROMUCOSAL | Status: AC
  Administered 2023-12-11: 15 mL via OROMUCOSAL

## 2023-12-11 MED ORDER — OXYCODONE HCL 5 MG PO TABS: 5.0000 mg | ORAL_TABLET | Freq: Once | ORAL | Status: DC | PRN

## 2023-12-11 MED ORDER — MIDAZOLAM HCL 2 MG/2ML IJ SOLN
INTRAMUSCULAR | Status: AC
  Filled 2023-12-11: qty 2

## 2023-12-11 MED ORDER — DEXAMETHASONE SOD PHOSPHATE PF 10 MG/ML IJ SOLN
INTRAMUSCULAR | Status: DC | PRN
  Administered 2023-12-11: 10 mg via INTRAVENOUS

## 2023-12-11 MED ORDER — ACETAMINOPHEN 500 MG PO TABS
1000.0000 mg | ORAL_TABLET | Freq: Once | ORAL | Status: AC
  Administered 2023-12-11: 1000 mg via ORAL
  Filled 2023-12-11: qty 2

## 2023-12-11 MED ORDER — STERILE WATER FOR IRRIGATION IR SOLN
Status: DC | PRN
  Administered 2023-12-11: 6000 mL

## 2023-12-11 MED ORDER — EPHEDRINE SULFATE-NACL 50-0.9 MG/10ML-% IV SOSY
PREFILLED_SYRINGE | INTRAVENOUS | Status: DC | PRN
  Administered 2023-12-11: 10 mg via INTRAVENOUS

## 2023-12-11 MED ORDER — ONDANSETRON HCL 4 MG/2ML IJ SOLN
INTRAMUSCULAR | Status: DC | PRN
  Administered 2023-12-11: 4 mg via INTRAVENOUS

## 2023-12-11 MED ORDER — MIDAZOLAM HCL (PF) 2 MG/2ML IJ SOLN
INTRAMUSCULAR | Status: DC | PRN
  Administered 2023-12-11: 2 mg via INTRAVENOUS

## 2023-12-11 MED ORDER — ONDANSETRON HCL 4 MG/2ML IJ SOLN: 4.0000 mg | Freq: Once | INTRAMUSCULAR | Status: DC | PRN

## 2023-12-11 SURGICAL SUPPLY — 10 items
BAG URO CATCHER STRL LF (MISCELLANEOUS) ×1 IMPLANT
GLOVE BIO SURGEON STRL SZ 6.5 (GLOVE) ×1 IMPLANT
GOWN STRL REUS W/ TWL LRG LVL3 (GOWN DISPOSABLE) ×1 IMPLANT
KIT TURNOVER KIT A (KITS) ×1 IMPLANT
MANIFOLD NEPTUNE II (INSTRUMENTS) ×1 IMPLANT
PACK CYSTO (CUSTOM PROCEDURE TRAY) ×1 IMPLANT
SYSTEM UROLIFT 2 CART W/ HNDL (Male Continence) IMPLANT
SYSTEM UROLIFT 2 CARTRIDGE (Male Continence) IMPLANT
TUBING CONNECTING 10 (TUBING) ×1 IMPLANT
WATER STERILE IRR 3000ML UROMA (IV SOLUTION) ×1 IMPLANT

## 2023-12-11 NOTE — Op Note (Signed)
 Preoperative diagnosis: BPH with obstructive symptomatology. Postoperative diagnosis: Same  Principal procedure: Urolift procedure with the placement of 7 implants with 5 remaining.  Surgeon: Nieves  Anesthesia: General  Complications: None  Drains: None  Estimated blood loss: Less than 25 mL  Indications: 68 year old with symptomatic BPH who is chosen prostatic urethral lift as treatment.  Findings: Using the 17 French cystoscope, urethra and bladder were inspected. There were no urethral lesions. Prostatic urethra was obstructed secondary to bilobar hypertrophy. The bladder was inspected circumferentially. This revealed normal findings.  Description of procedure: The patient was properly identified in the holding area. He received preoperative antibiotics. He was taken to the procedure room and placed in lithotomy position.  He was prepped and draped in the usual sterile fashion.  Timeout was performed to confirm the patient and procedure.  A 41F cystoscope was inserted into the bladder. The cystoscopy bridge was replaced with a UroLift delivery device.The first treatment site was the patient's left side approximately 1.5cm distal to the bladder neck. The distal tip of the delivery device was then angled laterally approximately 20 degrees at this position to compress the lateral lobe. The trigger was pulled, thereby deploying a needle containing the implant through the prostate. The needle was then retracted, allowing one end of the implant to be delivered to the capsular surface of the prostate. The implant was then tensioned to assure capsular seating and removal of slack monofilament. The device was then angled back toward midline and slowly advanced proximally until cystoscopic verification of the monofilament being centered in the delivery bay. The urethral end piece was then affixed to the monofilament thereby tailoring the size of the implant (1). Excess filament was then severed. The  delivery device was then re-advanced into the bladder.   The same procedure was then repeated on patient's right bladder neck.  We are trying to get anterior and had 2 bone strikes the (2 discarded implants).  We then went back to the left side and delivered an implant just proximal to the verumontanum (2).  We went back again to the right bladder neck and came in 1.5 cm distal to the bladder neck and used a little less anterior angle and placed in the implant here (3) following the same technique.  We then came and placed an implant just proximal to the right verumontanum (4).  Using the stent device we tested getting into the sulcus of the obstructing median lobe on the patient's right side and retracting it to the left pulling it down into the prostatic urethra away from the bladder neck and angling at about 330.  This looked like it completed and opened up the channel further.  Therefore a new device was placed and using this technique an implant was placed at the obstructing median lobe (5).  This created an excellent channel looking back from the area. A final cystoscopy was conducted first to inspect the location and state of each implant and second, to confirm the presence of a continuous anterior channel was present through the prostatic urethra with irrigation flow turned off.  7 implants were delivered in total with 5 placed.  All urethral end pieces were snug and the mucosa within the prostatic urethra number protruding into the bladder.  Hemostasis was excellent at low pressure.  The scope was removed. He was then awakened and taken to the recovery area in stable condition. He tolerated the procedure well.

## 2023-12-11 NOTE — Anesthesia Procedure Notes (Signed)
 Procedure Name: LMA Insertion Date/Time: 12/11/2023 12:49 PM  Performed by: Veronda Gabor E, CRNAPre-anesthesia Checklist: Patient identified, Patient being monitored, Timeout performed, Emergency Drugs available and Suction available Patient Re-evaluated:Patient Re-evaluated prior to induction Oxygen Delivery Method: Circle system utilized Preoxygenation: Pre-oxygenation with 100% oxygen Induction Type: IV induction Ventilation: Mask ventilation without difficulty LMA: LMA inserted LMA Size: 4.0 Tube type: Oral Number of attempts: 1 Placement Confirmation: positive ETCO2 and breath sounds checked- equal and bilateral Tube secured with: Tape Dental Injury: Teeth and Oropharynx as per pre-operative assessment

## 2023-12-11 NOTE — H&P (Signed)
 H&P  Chief Complaint: BPH   History of Present Illness: 68 yo make with h/o BPH. He tried 41-month daily tadalafil. Some improvement with frequency but only by about 10 to 15%. Still with bothersome urgency, sensation of incomplete bladder emptying. Hesitancy and intermittency of stream as well as nocturia 3-4 times nightly. Started on tamsulosin at night and tadalafil in the morning. Side effects with Flomax, lightheadedness and dizziness. He has stopped it and those symptoms improved. IPSS 28, PVR 100 mL. Nocturia is his biggest/most bothersome complaint, he will have some rare urge incontinence and postvoid dribbling. Sep 2025 pUS 55 g. UC 8.5 cc / sec. Cysto today - OCT 2025 --with a moderate size ball valving median lobe. He would be a good candidate for water vapor thermal therapy in the office, or PUL or laser ablation. He has a large capacity bladder.  He is well today without dysuria or gross hematuria.  No congestion or fever.   Past Medical History:  Diagnosis Date   Arthritis    Atherosclerotic heart disease    GERD (gastroesophageal reflux disease)    High cholesterol    RBBB (right bundle branch block)    Past Surgical History:  Procedure Laterality Date   MENISCUS REPAIR Bilateral    VASECTOMY      Home Medications:  Medications Prior to Admission  Medication Sig Dispense Refill Last Dose/Taking   acetaminophen (TYLENOL) 325 MG tablet Take 650 mg by mouth every 6 (six) hours as needed for moderate pain (pain score 4-6).   12/10/2023 at  8:30 PM   cyclobenzaprine (FLEXERIL) 10 MG tablet Take 5-10 mg by mouth daily as needed for muscle spasms.   Past Month   famotidine (PEPCID) 40 MG tablet Take 40 mg by mouth daily as needed for heartburn or indigestion.   12/10/2023 at  8:30 PM   rosuvastatin (CRESTOR) 10 MG tablet Take 10 mg by mouth daily.   12/10/2023 at  8:30 PM   tadalafil (CIALIS) 5 MG tablet Take 5 mg by mouth daily.   12/09/2023   aspirin EC 81 MG tablet Take 81 mg by  mouth daily. Swallow whole.   12/06/2023   meloxicam  (MOBIC ) 15 MG tablet Take 1 tablet (15 mg total) by mouth daily. (Patient not taking: Reported on 12/08/2023) 30 tablet 0 More than a month   methylPREDNISolone  (MEDROL  DOSEPAK) 4 MG TBPK tablet Take per packet instruction. Taper dosing. (Patient not taking: Reported on 12/08/2023) 1 each 0 More than a month   Allergies:  Allergies  Allergen Reactions   Pseudoephedrine Other (See Comments)    jittery   Tamsulosin     Dizziness    History reviewed. No pertinent family history. Social History:  reports that he has never smoked. He has never used smokeless tobacco. He reports that he does not currently use alcohol. He reports that he does not use drugs.  ROS: A complete review of systems was performed.  All systems are negative except for pertinent findings as noted. Review of Systems  All other systems reviewed and are negative.    Physical Exam:  Vital signs in last 24 hours: Temp:  [98 F (36.7 C)-98.3 F (36.8 C)] 98 F (36.7 C) (11/07 1033) Pulse Rate:  [68-69] 69 (11/07 1033) Resp:  [16-26] 26 (11/07 1033) BP: (106-164)/(71-84) 164/84 (11/07 1033) SpO2:  [98 %-100 %] 98 % (11/07 1033) Weight:  [73.6 kg] 73.6 kg (11/06 1311) General:  Alert and oriented, No acute distress HEENT: Normocephalic, atraumatic  Cardiovascular: Regular rate and rhythm Lungs: Regular rate and effort Abdomen: Soft, nontender, nondistended, no abdominal masses Back: No CVA tenderness Extremities: No edema Neurologic: Grossly intact  Laboratory Data:  Results for orders placed or performed during the hospital encounter of 12/10/23 (from the past 24 hours)  Basic metabolic panel per protocol     Status: Abnormal   Collection Time: 12/10/23  1:36 PM  Result Value Ref Range   Sodium 140 135 - 145 mmol/L   Potassium 5.0 3.5 - 5.1 mmol/L   Chloride 106 98 - 111 mmol/L   CO2 25 22 - 32 mmol/L   Glucose, Bld 96 70 - 99 mg/dL   BUN 25 (H) 8 - 23  mg/dL   Creatinine, Ser 8.70 (H) 0.61 - 1.24 mg/dL   Calcium 9.5 8.9 - 89.6 mg/dL   GFR, Estimated >39 >39 mL/min   Anion gap 9 5 - 15   No results found for this or any previous visit (from the past 240 hours). Creatinine: Recent Labs    12/10/23 1336  CREATININE 1.29*    Impression/Assessment:  BPH-  Plan:  I discussed with the patient and his wife again the nature, potential benefits, risks and alternatives to prostatic urethral lift, including side effects of the proposed treatment, the likelihood of the patient achieving the goals of the procedure, and any potential problems that might occur during the procedure or recuperation.  Discussed in most cases he can go home without a catheter but he may need one. Discussed risk of bleeding among others.  All questions answered. Patient elects to proceed.    Donnice Brooks 12/11/2023, 11:09 AM

## 2023-12-11 NOTE — Anesthesia Preprocedure Evaluation (Addendum)
 Anesthesia Evaluation  Patient identified by MRN, date of birth, ID band Patient awake    Reviewed: Allergy & Precautions, NPO status , Patient's Chart, lab work & pertinent test results  Airway Mallampati: III  TM Distance: >3 FB Neck ROM: Full    Dental  (+) Teeth Intact, Dental Advisory Given   Pulmonary  Snores at night, no sleep study    Pulmonary exam normal breath sounds clear to auscultation       Cardiovascular hypertension (164/84 preop, no home meds), Normal cardiovascular exam+ dysrhythmias (known RBBB)  Rhythm:Regular Rate:Normal     Neuro/Psych negative neurological ROS  negative psych ROS   GI/Hepatic Neg liver ROS,GERD  Controlled and Medicated,,  Endo/Other  negative endocrine ROS    Renal/GU Renal disease (cr 1.29)  negative genitourinary   Musculoskeletal  (+) Arthritis , Osteoarthritis,    Abdominal   Peds  Hematology negative hematology ROS (+)   Anesthesia Other Findings   Reproductive/Obstetrics negative OB ROS                              Anesthesia Physical Anesthesia Plan  ASA: 3  Anesthesia Plan: General   Post-op Pain Management: Tylenol PO (pre-op)*   Induction: Intravenous  PONV Risk Score and Plan: 2 and Ondansetron, Dexamethasone, Treatment may vary due to age or medical condition and Midazolam  Airway Management Planned: LMA  Additional Equipment: None  Intra-op Plan:   Post-operative Plan: Extubation in OR  Informed Consent: I have reviewed the patients History and Physical, chart, labs and discussed the procedure including the risks, benefits and alternatives for the proposed anesthesia with the patient or authorized representative who has indicated his/her understanding and acceptance.     Dental advisory given  Plan Discussed with: CRNA  Anesthesia Plan Comments:          Anesthesia Quick Evaluation

## 2023-12-11 NOTE — Anesthesia Postprocedure Evaluation (Signed)
 Anesthesia Post Note  Patient: Larnell DEL Ziebarth  Procedure(s) Performed: CYSTOSCOPY WITH INSERTION OF UROLIFT (Prostate)     Patient location during evaluation: PACU Anesthesia Type: General Level of consciousness: awake and alert, oriented and patient cooperative Pain management: pain level controlled Vital Signs Assessment: post-procedure vital signs reviewed and stable Respiratory status: spontaneous breathing, nonlabored ventilation and respiratory function stable Cardiovascular status: blood pressure returned to baseline and stable Postop Assessment: no apparent nausea or vomiting Anesthetic complications: no   No notable events documented.  Last Vitals:  Vitals:   12/11/23 1400 12/11/23 1415  BP: (!) 148/72 (!) 151/72  Pulse: 70 69  Resp: 18 13  Temp:  (!) 36.3 C  SpO2: 100% 100%    Last Pain:  Vitals:   12/11/23 1415  TempSrc:   PainSc: 0-No pain                 Almarie CHRISTELLA Marchi

## 2023-12-11 NOTE — Transfer of Care (Signed)
 Immediate Anesthesia Transfer of Care Note  Patient: Clarence Foster  Procedure(s) Performed: Procedure(s): CYSTOSCOPY WITH INSERTION OF UROLIFT (N/A)  Patient Location: PACU  Anesthesia Type:General  Level of Consciousness: Alert, Awake, Oriented  Airway & Oxygen Therapy: Patient Spontanous Breathing  Post-op Assessment: Report given to RN  Post vital signs: Reviewed and stable  Last Vitals:  Vitals:   12/11/23 1033 12/11/23 1345  BP: (!) 164/84 (!) 148/82  Pulse: 69 74  Resp: (!) 26 18  Temp: 36.7 C (!) 35.9 C  SpO2: 98% 100%    Complications: No apparent anesthesia complications

## 2023-12-14 ENCOUNTER — Encounter (HOSPITAL_COMMUNITY): Payer: Self-pay | Admitting: Urology

## 2023-12-24 DIAGNOSIS — R350 Frequency of micturition: Secondary | ICD-10-CM | POA: Diagnosis not present

## 2023-12-24 DIAGNOSIS — R310 Gross hematuria: Secondary | ICD-10-CM | POA: Diagnosis not present

## 2023-12-24 DIAGNOSIS — N401 Enlarged prostate with lower urinary tract symptoms: Secondary | ICD-10-CM | POA: Diagnosis not present
